# Patient Record
Sex: Male | Born: 1955 | Race: Black or African American | Hispanic: No | Marital: Single | Smoking: Former smoker
Health system: Southern US, Academic
[De-identification: ages and names within clinical notes are randomized; demographics above are authoritative.]

## PROBLEM LIST (undated history)

## (undated) DIAGNOSIS — I10 Essential (primary) hypertension: Secondary | ICD-10-CM

## (undated) DIAGNOSIS — E119 Type 2 diabetes mellitus without complications: Secondary | ICD-10-CM

## (undated) DIAGNOSIS — G459 Transient cerebral ischemic attack, unspecified: Secondary | ICD-10-CM

## (undated) DIAGNOSIS — E7800 Pure hypercholesterolemia, unspecified: Secondary | ICD-10-CM

## (undated) DIAGNOSIS — F419 Anxiety disorder, unspecified: Secondary | ICD-10-CM

## (undated) DIAGNOSIS — J45909 Unspecified asthma, uncomplicated: Secondary | ICD-10-CM

## (undated) DIAGNOSIS — H5440 Blindness, one eye, unspecified eye: Secondary | ICD-10-CM

## (undated) DIAGNOSIS — T300 Burn of unspecified body region, unspecified degree: Secondary | ICD-10-CM

## (undated) DIAGNOSIS — I639 Cerebral infarction, unspecified: Secondary | ICD-10-CM

## (undated) HISTORY — PX: HX FREE SKIN GRAFT: SHX16

## (undated) HISTORY — DX: Transient cerebral ischemic attack, unspecified: G45.9

## (undated) HISTORY — PX: HX TUMOR REMOVAL: SHX12

## (undated) HISTORY — DX: Type 2 diabetes mellitus without complications: E11.9

## (undated) HISTORY — DX: Blindness, one eye, unspecified eye: H54.40

## (undated) HISTORY — DX: Pure hypercholesterolemia, unspecified: E78.00

## (undated) HISTORY — DX: Burn of unspecified body region, unspecified degree: T30.0

## (undated) HISTORY — DX: Unspecified asthma, uncomplicated: J45.909

## (undated) HISTORY — DX: Anxiety disorder, unspecified: F41.9

## (undated) HISTORY — PX: TENDON RELEASE: SHX230

## (undated) HISTORY — DX: Essential (primary) hypertension: I10

## (undated) HISTORY — PX: HX OTHER: 2100001105

## (undated) HISTORY — DX: Cerebral infarction, unspecified (CMS HCC): I63.9

---

## 2006-06-15 ENCOUNTER — Emergency Department (HOSPITAL_COMMUNITY): Payer: Self-pay | Admitting: EXTERNAL

## 2010-06-23 DIAGNOSIS — I639 Cerebral infarction, unspecified: Secondary | ICD-10-CM

## 2010-06-23 HISTORY — DX: Cerebral infarction, unspecified: I63.9

## 2011-08-23 DIAGNOSIS — G8921 Chronic pain due to trauma: Secondary | ICD-10-CM | POA: Insufficient documentation

## 2012-01-12 IMAGING — CT CT CHEST W/ CM
2 of 4 series · 14 of 42 positions shown, 19 images · IV contrast (isovue)
Comparison: none

REASON FOR EXAM: chest pain
COMMENTS:

PROCEDURE:     CT  - CT CHEST WITH CONTRAST  - March 30, 2010  [DATE]
RESULT:     Comparison: None.
TECHNIQUE: Thoracic aorta protocol CT of the chest, after the administration
of 100 mL Isovue 370 intravenous contrast.

[Series 4: aaa · axial · 0.62mm/px · z∈[-265,-16]mm · 11 of 95 slices shown, 16 images (1 of 2)]
[im 6/95  soft-tissue]
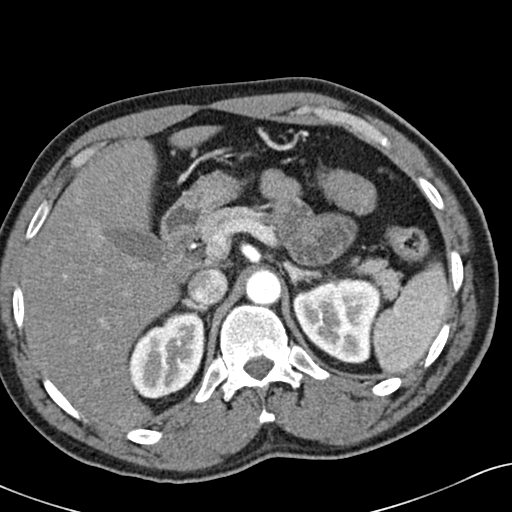
[im 6/95  bone]
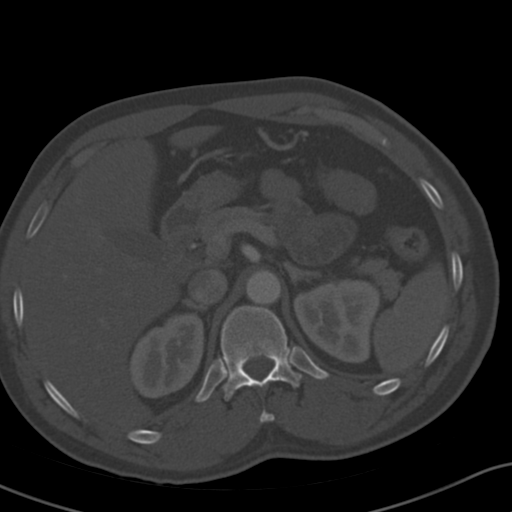
[im 18/95  soft-tissue]
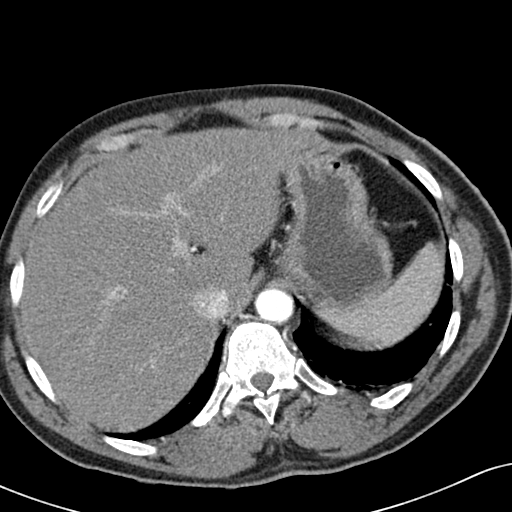
[im 24/95  soft-tissue]
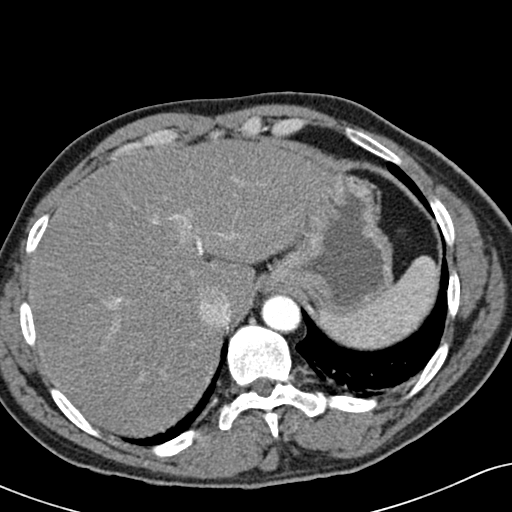
[im 36/95  soft-tissue]
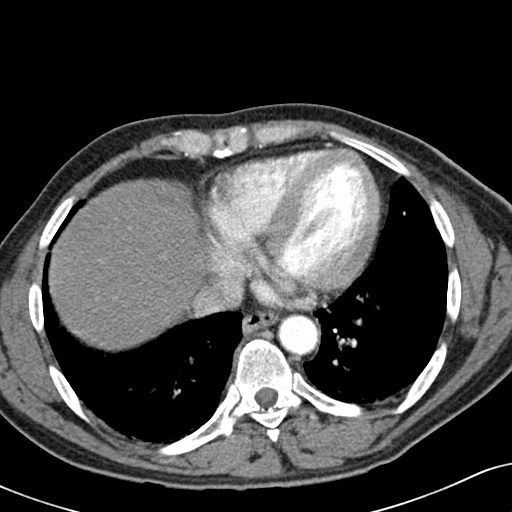
[im 42/95  soft-tissue]
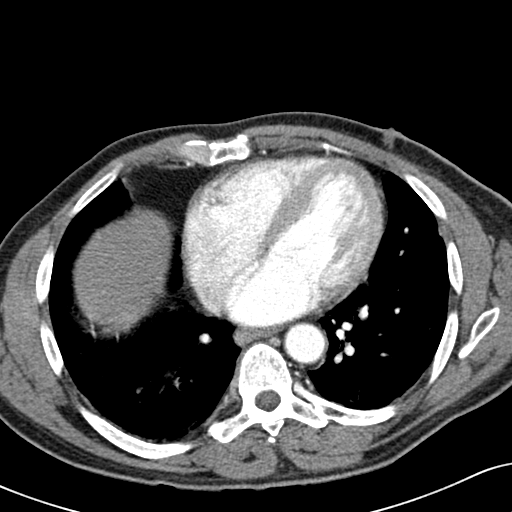
[im 53/95  soft-tissue]
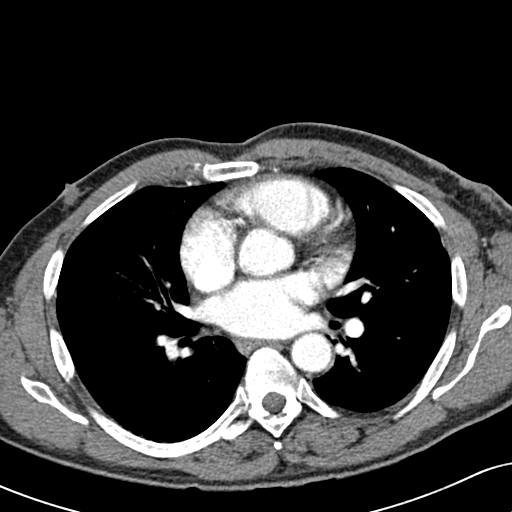
[im 59/95  soft-tissue]
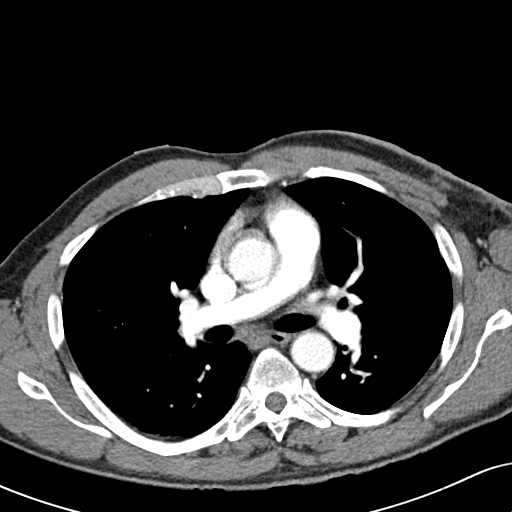
[im 71/95  soft-tissue]
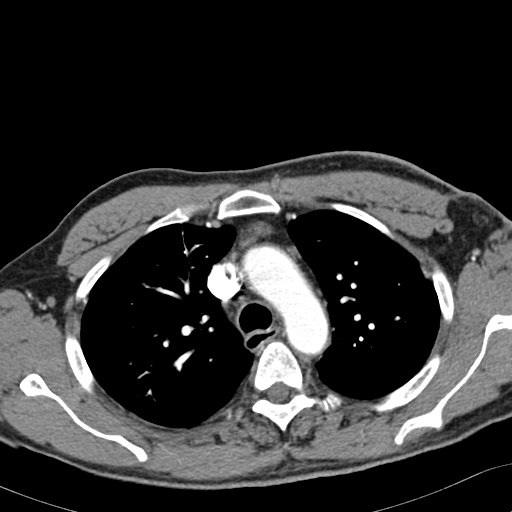
[im 71/95  lung]
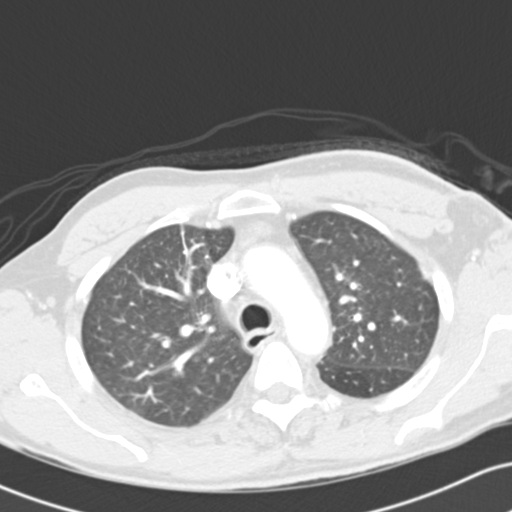
[im 77/95  soft-tissue]
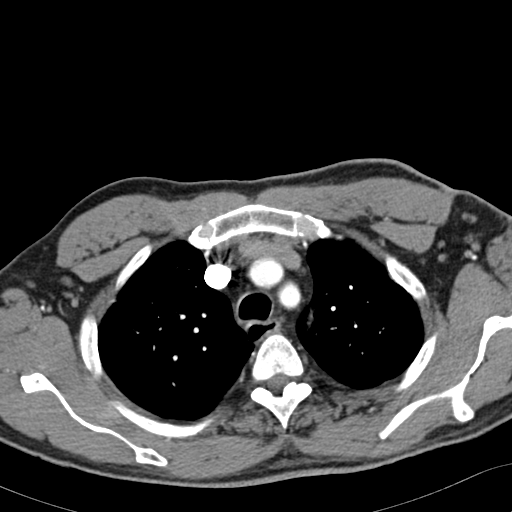
[im 77/95  lung]
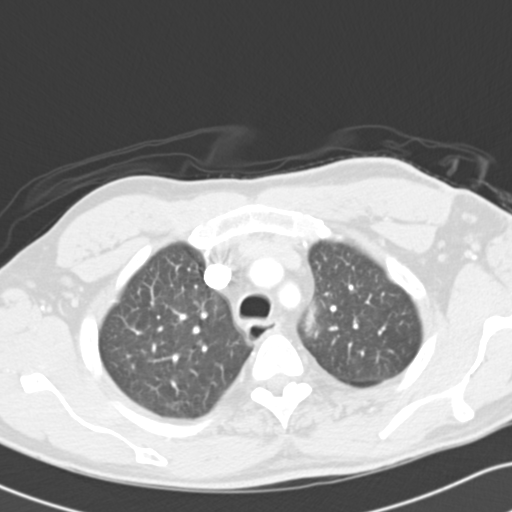
[im 77/95  bone]
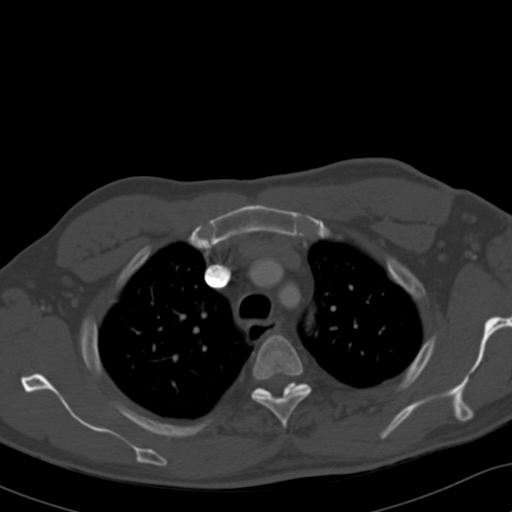
[im 83/95  lung]
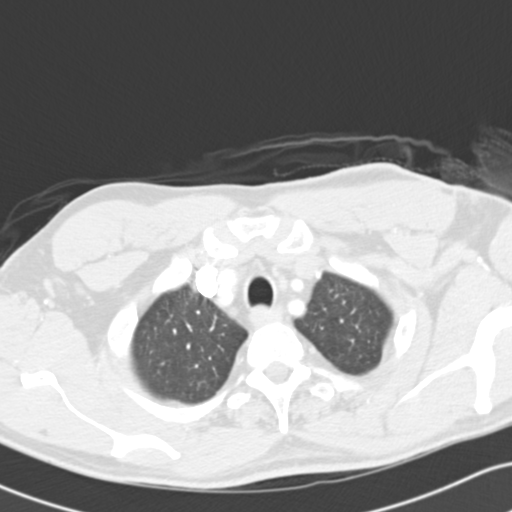
[im 89/95  soft-tissue]
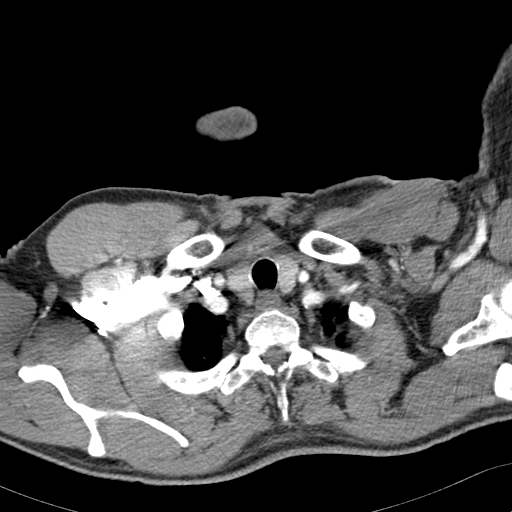
[im 89/95  lung]
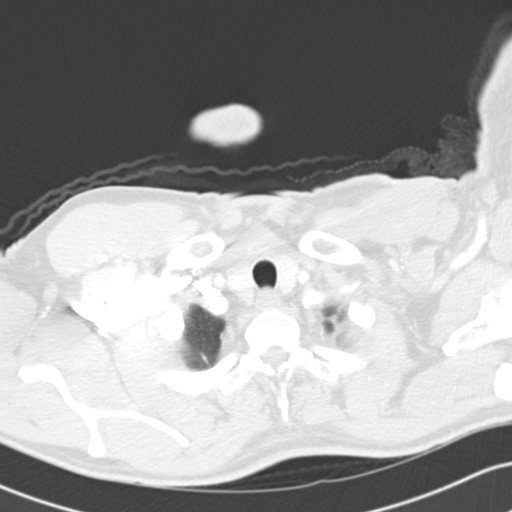

[Series 10: aaa · coronal · 0.62mm/px · 3 of 73 slices shown (2 of 2)]
[im 25/73  soft-tissue]
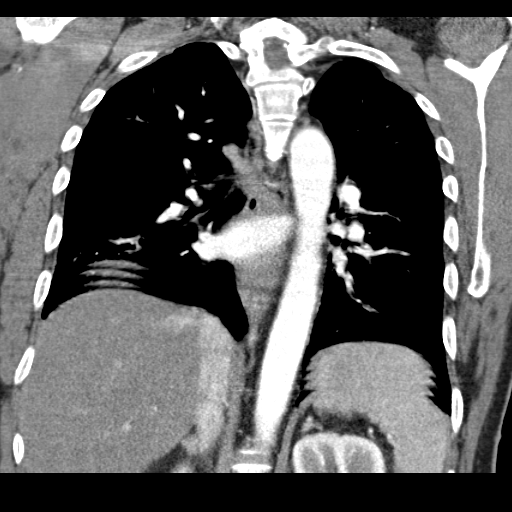
[im 33/73  soft-tissue]
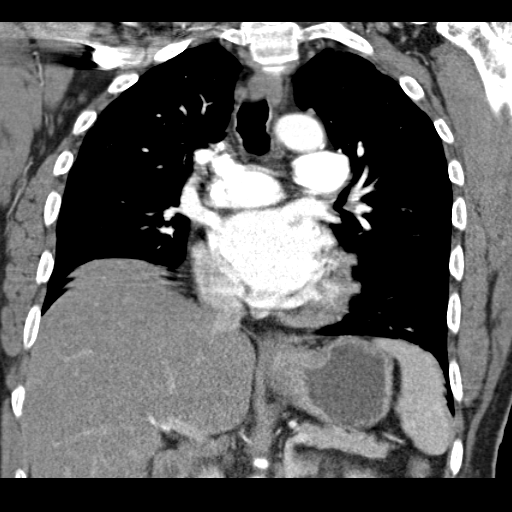
[im 41/73  soft-tissue]
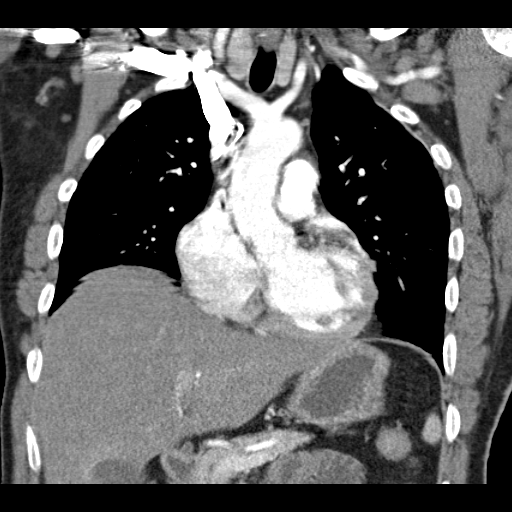

[14 of 42 positions shown; findings below may reference images not displayed]

FINDINGS: Evaluation is limited by respiratory motion. No mediastinal, hilar, or
axillary lymphadenopathy identified. The liver is somewhat low in
attenuation, suggesting hepatic steatosis.

The thoracic aorta is normal in caliber. There is no evidence of aortic
dissection or periaortic hematoma. Evaluation of the aortic root is slightly
limited by cardiac motion and respiratory motion. No central pulmonary
embolus identified.

No focal parenchymal pulmonary opacities identified. Minimal basilar
opacities likely represent atelectasis.

No aggressive lytic or sclerotic osseous lesions identified.
IMPRESSION: No evidence of thoracic aortic dissection, aneurysm, or periaortic hematoma.

## 2012-01-12 IMAGING — CR DG CHEST 1V PORT
1 series · 1 of 1 positions shown · non-contrast
Comparison: none

REASON FOR EXAM: abd pain
COMMENTS:

PROCEDURE:     DXR - DXR PORTABLE CHEST SINGLE VIEW  - March 30, 2010 [DATE]
RESULT:     Comparison: None.

[view not recorded]
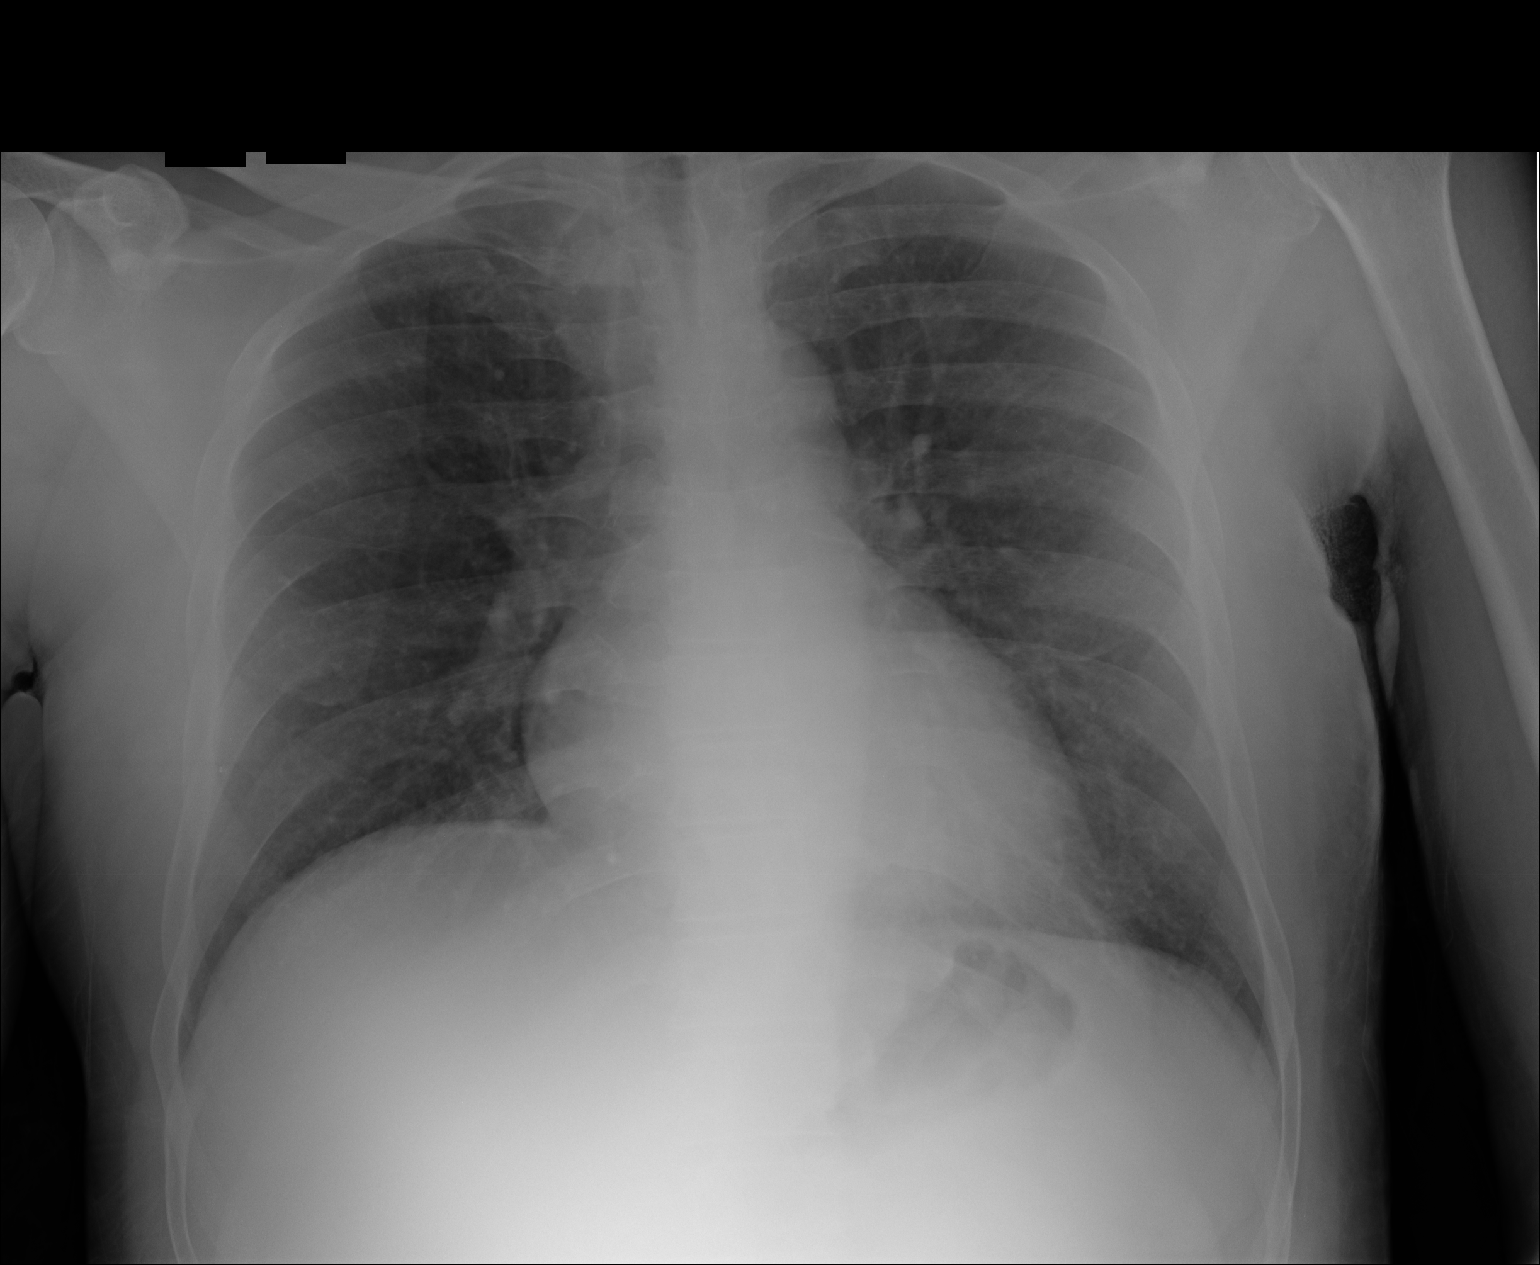

[1 of 1 positions shown; findings below may reference images not displayed]

FINDINGS: Heart size upper limits normal. Nodular density at the left lung apex is
felt to represent the overlying costovertebral junction. The lungs are clear.
IMPRESSION: No acute cardio pulmonary disease.
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
## 2014-08-30 ENCOUNTER — Ambulatory Visit (INDEPENDENT_AMBULATORY_CARE_PROVIDER_SITE_OTHER): Payer: Self-pay

## 2014-08-30 NOTE — Telephone Encounter (Signed)
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 Records are being faxed for review with MRI results. Please review and advise.  Thanks.

## 2014-08-30 NOTE — Telephone Encounter (Signed)
I called and spoke with pt. He said that he is having his medical records released to [place]. As soon as we receive the referral information we will review it and schedule him appropriately. He was seen at [facility] and they said that they can't do the surgery. So he is going to be seen here.  [name] Capri, LPN  09/28/3505, 57:32

## 2014-09-13 ENCOUNTER — Encounter (HOSPITAL_COMMUNITY): Payer: Self-pay
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
## 2014-12-19 ENCOUNTER — Other Ambulatory Visit (INDEPENDENT_AMBULATORY_CARE_PROVIDER_SITE_OTHER): Payer: Self-pay | Admitting: Physician Assistant

## 2014-12-19 ENCOUNTER — Ambulatory Visit (INDEPENDENT_AMBULATORY_CARE_PROVIDER_SITE_OTHER): Payer: MEDICAID | Admitting: Neurological Surgery

## 2014-12-19 ENCOUNTER — Ambulatory Visit (HOSPITAL_COMMUNITY): Payer: Self-pay | Admitting: NEUROSURGERY

## 2014-12-19 ENCOUNTER — Encounter (INDEPENDENT_AMBULATORY_CARE_PROVIDER_SITE_OTHER): Payer: Self-pay | Admitting: Neurological Surgery

## 2014-12-19 VITALS — BP 130/60 | HR 61 | Temp 96.5°F | Resp 16 | Ht 70.0 in | Wt 194.6 lb

## 2014-12-19 DIAGNOSIS — M4806 Spinal stenosis, lumbar region: Secondary | ICD-10-CM

## 2014-12-19 DIAGNOSIS — M546 Pain in thoracic spine: Secondary | ICD-10-CM

## 2014-12-19 DIAGNOSIS — M48061 Spinal stenosis, lumbar region without neurogenic claudication: Secondary | ICD-10-CM

## 2014-12-19 DIAGNOSIS — G96198 Other disorders of meninges, not elsewhere classified: Secondary | ICD-10-CM

## 2014-12-19 DIAGNOSIS — G9619 Other disorders of meninges, not elsewhere classified: Secondary | ICD-10-CM
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
## 2014-12-20 ENCOUNTER — Encounter (INDEPENDENT_AMBULATORY_CARE_PROVIDER_SITE_OTHER): Payer: Self-pay | Admitting: Neurological Surgery

## 2014-12-26 ENCOUNTER — Ambulatory Visit (INDEPENDENT_AMBULATORY_CARE_PROVIDER_SITE_OTHER): Payer: Self-pay | Admitting: Neurological Surgery

## 2014-12-26 NOTE — Telephone Encounter (Signed)
Informed patient MRI lumbar WO scheduled 01-04-15 @ 9:15 am.  Need to arrive at the main entrance of [facility] @ 8:45 am to register.  Patient verbalized understanding.  Faxed and Mailed order.  Sent staff message to front desk to schedule follow up with Dr. Bill Salinas.    [facility] M/CAID YOOJ#[id number] valid 6.29.16 to 8.28.16 7.5tcreamer    [place], MA  12/26/2014, 14:59

## 2015-01-09 ENCOUNTER — Ambulatory Visit (INDEPENDENT_AMBULATORY_CARE_PROVIDER_SITE_OTHER): Payer: Self-pay | Admitting: Neurological Surgery

## 2015-01-09 NOTE — Telephone Encounter (Signed)
-----   Message from Edwyna Shell, [place] sent at 01/09/2015  8:45 AM EDT -----  Regarding: FW: Matthew Sparks-status of MTM form   >> Matthew Sparks 01/09/2015 08:38 AM  Matthew Sparks    Pt calling and states MTM sent paperwork to you to have filled out so he can get transportation tomorrow for his MRI at [facility].   He is needing this filled out ASAP and was calling to check on status of form.  Please call pt back with status.  thanks

## 2015-01-09 NOTE — Telephone Encounter (Signed)
Faxed distance form as requested.   [place], MA  01/09/2015, 16:27

## 2015-01-10 ENCOUNTER — Ambulatory Visit (INDEPENDENT_AMBULATORY_CARE_PROVIDER_SITE_OTHER): Payer: Self-pay | Admitting: Neurological Surgery

## 2015-01-10 NOTE — Telephone Encounter (Signed)
-----   Message from [place], [place] sent at 01/10/2015  1:08 PM EDT -----  >> TINA EDMONDS 01/10/2015 12:49 PM  Dr. Rodman Key, pt's case mgr with [facility] called stating that she just spoke to MTM and they have not received the form they requested.  Cherlyn Cushing wants to know what number you are faxing it to.  Please call her at [phone] ext. [phone]  Previous message regarding form was as follows:    Pt calling and states MTM sent paperwork to you to have filled out so he can get transportation tomorrow for his MRI at [facility].  He is needing this filled out ASAP and was calling to check on status of form. Please call pt back with status. thanks

## 2015-01-10 NOTE — Telephone Encounter (Signed)
I called Matthew Sparks back but had to leave her a message I gave her my direct line to call me back at.  I also left the fax number that I faxed it to yesterday.   [place], MA  01/10/2015, 13:24

## 2015-01-11 ENCOUNTER — Encounter (INDEPENDENT_AMBULATORY_CARE_PROVIDER_SITE_OTHER): Payer: Self-pay | Admitting: Neurological Surgery

## 2015-01-23 ENCOUNTER — Ambulatory Visit (INDEPENDENT_AMBULATORY_CARE_PROVIDER_SITE_OTHER): Payer: Self-pay | Admitting: Neurological Surgery

## 2015-01-23 NOTE — Telephone Encounter (Signed)
I called patient to let him know that I received the paper just waiting for Heather to sign for me.   Patient verbally agreed to this.   [place], MA  01/23/2015, 14:33

## 2015-01-23 NOTE — Telephone Encounter (Signed)
-----   Message from [place], [place] sent at 01/23/2015  1:10 PM EDT -----  Regarding: FW: Matthew Sparks  >> JULIE COLLINS 01/23/2015 12:59 PM  Matthew Sparks pt - Pt is calling about a distance verification form for his upcoming 02/13/2015 appt. Pt would like to know if we got this form. Thanks.

## 2015-01-25 ENCOUNTER — Ambulatory Visit (INDEPENDENT_AMBULATORY_CARE_PROVIDER_SITE_OTHER): Payer: Self-pay | Admitting: Neurological Surgery

## 2015-01-25 NOTE — Telephone Encounter (Signed)
Faxed mileage form over as requested, had to wait for Heather to sign it.   [place], MA  01/25/2015, 16:09

## 2015-01-31 ENCOUNTER — Ambulatory Visit (INDEPENDENT_AMBULATORY_CARE_PROVIDER_SITE_OTHER): Payer: Self-pay | Admitting: Neurological Surgery

## 2015-01-31 NOTE — Telephone Encounter (Signed)
I called patient back to let him know that I will redo the order and fax it back to them.   Patient was very confused on why they denied him, he then went on to tell me the previous   Doctor that he was seeing close to him was out of network for him so then he was referred to   Dr. Bill Salinas who is currently in network for him. I told him that I was going to use that for the   Reasoning for his form, patient verbally agreed. He also stated that he should have the right to   Be able to stay with Dr. Bill Salinas, he does not want to leave her care he likes her and she went through   A lot of trouble for him to help him out he stated. I faxed over the corrected form as requested.   [place], MA  01/31/2015, 13:29
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
## 2015-02-01 ENCOUNTER — Ambulatory Visit (INDEPENDENT_AMBULATORY_CARE_PROVIDER_SITE_OTHER): Payer: Self-pay | Admitting: Neurological Surgery

## 2015-02-01 NOTE — Telephone Encounter (Signed)
I called patient and Matthew Sparks from [facility] and explained that once I heard back from the PA or doctor I would call them back.  The patient was persistent about still seeing Dr.[name] for the surgery (if he was surgery) and could make it to the surgery day.  I explained thinking long term he would still need to follow up for post operative visits in our clinic.  After explaining this to him he was ok with being referred to [facility] to see a neurosurgeon.  I sent a message to PA to see if we could refer him.    Lowella Dell Filius, MA  02/01/2015, 13:24

## 2015-02-02 ENCOUNTER — Ambulatory Visit (INDEPENDENT_AMBULATORY_CARE_PROVIDER_SITE_OTHER): Payer: Self-pay | Admitting: Neurological Surgery

## 2015-02-02 NOTE — Telephone Encounter (Signed)
I called patient back he states that he is going to try to be seen up at [facility] since the address is the same for the hospital and Dr. Bill Salinas.   Then the patient is going to try to have surgery up at [facility] as well. Patient requested his apt be with Dr. Bill Salinas but up at [facility].   I sent a message to the call center to change it for me and for them to call the patient and let him know.   [place], MA  02/02/2015, 08:06

## 2015-02-02 NOTE — Telephone Encounter (Signed)
-----   Message from [place] sent at 02/02/2015  7:54 AM EDT -----  Regarding: Matthew Sparks  >> AGNES COROB 02/02/2015 07:54 AM  Matthew Sparks pt- pt has me confused.  Pt canc his appt at [facility] and reschedule it to [place].  Pt states that "they won't pay him to come to appt  @ [place]".  Pt was also talking about surg but i cannot tell if he has been scheduled for surg.  Can reach pt at [phone].  Pt asked this message to be sent i'm really not sure what he wants to speak with you about.  Thank you

## 2015-02-05 ENCOUNTER — Ambulatory Visit (INDEPENDENT_AMBULATORY_CARE_PROVIDER_SITE_OTHER): Payer: Self-pay | Admitting: Neurological Surgery

## 2015-02-05 NOTE — Telephone Encounter (Signed)
I called patient and left message stating that I would keep his apt with Dr. Bill Salinas   On 02-13-15 for 10 am. I told him if he could not make this apt we would need to   Push it out until September or October. Sent message to front desk to have patient   Put back onto the schedule.   [place], MA  02/05/2015, 11:04

## 2015-02-06 DIAGNOSIS — I6522 Occlusion and stenosis of left carotid artery: Secondary | ICD-10-CM

## 2015-02-08 ENCOUNTER — Encounter (INDEPENDENT_AMBULATORY_CARE_PROVIDER_SITE_OTHER): Payer: MEDICAID | Admitting: Neurological Surgery

## 2015-02-13 ENCOUNTER — Ambulatory Visit (INDEPENDENT_AMBULATORY_CARE_PROVIDER_SITE_OTHER): Payer: MEDICAID | Admitting: Neurological Surgery

## 2015-02-13 ENCOUNTER — Encounter (INDEPENDENT_AMBULATORY_CARE_PROVIDER_SITE_OTHER): Payer: MEDICAID | Admitting: Neurological Surgery

## 2015-02-13 ENCOUNTER — Encounter (INDEPENDENT_AMBULATORY_CARE_PROVIDER_SITE_OTHER): Payer: Self-pay | Admitting: Neurological Surgery

## 2015-02-13 VITALS — BP 120/76 | HR 69 | Temp 96.4°F | Resp 16 | Ht 70.0 in | Wt 188.4 lb

## 2015-02-13 DIAGNOSIS — G930 Cerebral cysts: Principal | ICD-10-CM

## 2015-02-13 DIAGNOSIS — T300 Burn of unspecified body region, unspecified degree: Secondary | ICD-10-CM
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
## 2015-02-14 ENCOUNTER — Ambulatory Visit (INDEPENDENT_AMBULATORY_CARE_PROVIDER_SITE_OTHER): Payer: Self-pay | Admitting: Neurological Surgery
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
## 2015-02-14 NOTE — Telephone Encounter (Signed)
Faxed referral to Tammy at the [facility].  [place], MA  02/14/2015, 15:25

## 2015-02-15 ENCOUNTER — Telehealth (INDEPENDENT_AMBULATORY_CARE_PROVIDER_SITE_OTHER): Payer: Self-pay | Admitting: Neurological Surgery
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
## 2015-02-15 NOTE — Telephone Encounter (Signed)
I called and left message for patient to let him know that I spoke to Gene at Dr. Roxana Hires office and they are going to   Try to get him in for the next available which may be around October. She stated that she was going to mail an apt letter out for   Him, She received all of our information and he will not need to be considered a new patient due to him being seen there back in  2015. I left there office number for patient incase he needed to contact them.   [place], MA  02/15/2015, 14:40

## 2015-02-19 ENCOUNTER — Ambulatory Visit (INDEPENDENT_AMBULATORY_CARE_PROVIDER_SITE_OTHER): Payer: Self-pay | Admitting: Neurological Surgery

## 2015-02-19 NOTE — Telephone Encounter (Signed)
Informed patient CT myelogram thoracic scheduled 03-09-15 @ 10:00 am.  Need to arrive at the main entrance of [facility] @ 8:30 am to register.  Patient verbalized understanding.  Faxed and Mailed order.  No follow up needed patient will be seeing Dr. Bill Salinas on 03-20-15 already.     Hampton Sparks M/CAID auth#[id number]. This request is only valid from 02/14/2015 to 04/15/2015. [email or web address]    Matthew Beach, MA  02/19/2015, 15:30

## 2015-02-22 ENCOUNTER — Encounter (INDEPENDENT_AMBULATORY_CARE_PROVIDER_SITE_OTHER): Payer: MEDICAID | Admitting: Neurological Surgery

## 2015-03-08 ENCOUNTER — Ambulatory Visit (INDEPENDENT_AMBULATORY_CARE_PROVIDER_SITE_OTHER): Payer: Self-pay | Admitting: Neurological Surgery
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
## 2015-03-08 NOTE — Telephone Encounter (Signed)
-----   Message from Mayme Genta sent at 03/08/2015  8:14 AM EDT -----  Regarding: sedney  >> DONNA MILLER 03/08/2015 08:14 AM  [name] - pt is scheduled for CT Myelogram tomorrow and said he forgot and took Tramadol yesterday.  Does he need to be rescheduled? Please call.    Thanks  Butch Penny

## 2015-03-08 NOTE — Telephone Encounter (Signed)
I called and spoke to out patient surgery and they stated that as long as if was taken before his scheduled myelogram he will   Be fine, but if it was after 10 am then he would need to reschedule it. I called patient and he stated that he did take it earlier than   10 am he took it as soon as he got up not thinking. I told him that was fine and to make sure that he does not take anymore.   He verbally agreed to this.   [place], MA  03/08/2015, 08:24

## 2015-03-14 ENCOUNTER — Ambulatory Visit (INDEPENDENT_AMBULATORY_CARE_PROVIDER_SITE_OTHER): Payer: Self-pay | Admitting: Neurological Surgery

## 2015-03-14 NOTE — Telephone Encounter (Signed)
I called patient to let him know that I would need to move his follow up with Dr. Bill Salinas out since he moved his myelogram apt.   Paitent verbally agreed to this and I told him that it would be on 04-17-15 he was ok with this once he realized that she would only be here  Two days in October. He wanted me to mail him out a time and date and I did as requested.   [place], MA  03/14/2015, 10:32

## 2015-03-20 ENCOUNTER — Encounter (INDEPENDENT_AMBULATORY_CARE_PROVIDER_SITE_OTHER): Payer: MEDICAID | Admitting: Neurological Surgery

## 2015-04-17 ENCOUNTER — Encounter (INDEPENDENT_AMBULATORY_CARE_PROVIDER_SITE_OTHER): Payer: Self-pay | Admitting: Neurological Surgery

## 2015-04-17 ENCOUNTER — Ambulatory Visit (INDEPENDENT_AMBULATORY_CARE_PROVIDER_SITE_OTHER): Payer: MEDICAID | Admitting: Neurological Surgery

## 2015-04-17 VITALS — BP 116/60 | HR 72 | Temp 96.2°F | Resp 16 | Ht 70.0 in | Wt 180.0 lb

## 2015-04-17 DIAGNOSIS — M545 Low back pain, unspecified: Secondary | ICD-10-CM

## 2015-04-17 DIAGNOSIS — M546 Pain in thoracic spine: Secondary | ICD-10-CM
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
## 2015-04-17 NOTE — Progress Notes (Signed)
Staff note:  I personally saw and evaluated the patient. See mid-level's note for additional details. My findings/participation are:  59 yo male here in followup of myelogram.  Myelogram shows no filling defect or slow filling to suggest arachnoid cyst.  I don't definitively see anything to operate on at this point, especially given his symptoms which are vague and not suggestive of thoracic myelopathy.  However, he continues to have problems with gait so we will try to get his records from [facility] and I will show his pictures around.  I will call him with further information given his long distance away.    Leeroy Cha, MD

## 2015-04-18 ENCOUNTER — Ambulatory Visit (INDEPENDENT_AMBULATORY_CARE_PROVIDER_SITE_OTHER): Payer: Self-pay | Admitting: Neurological Surgery

## 2015-04-18 NOTE — Telephone Encounter (Addendum)
I called and left a message advising patient that he will need to contact Medical records to check the status of the form. If he calls back please advise him of this.  [name] Capri, LPN  97/28/2060, 15:61      ----- Message from Mayme Genta sent at 04/18/2015  7:58 AM EDT -----  Regarding: sedney  >> DONNA MILLER 04/18/2015 07:58 AM  sedney - pt states that MTM is sending a form for you to sign and he would like a call once it is filled out.       Thanks,  Butch Penny

## 2015-05-02 ENCOUNTER — Telehealth (INDEPENDENT_AMBULATORY_CARE_PROVIDER_SITE_OTHER): Payer: Self-pay | Admitting: Neurological Surgery

## 2015-05-02 NOTE — Telephone Encounter (Signed)
I called and left message for patient letting him know that we cannot give him anything stating that and that   Was per Dr. Bill Salinas. I left my direct line for him to call back with any questions.   [place], MA  05/02/2015, 12:01
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
## 2015-05-22 ENCOUNTER — Encounter (INDEPENDENT_AMBULATORY_CARE_PROVIDER_SITE_OTHER): Payer: Self-pay | Admitting: Neurological Surgery

## 2015-05-22 ENCOUNTER — Ambulatory Visit (INDEPENDENT_AMBULATORY_CARE_PROVIDER_SITE_OTHER): Payer: MEDICAID | Admitting: Neurological Surgery

## 2015-05-22 VITALS — BP 124/66 | HR 62 | Temp 96.8°F | Resp 16 | Ht 70.0 in | Wt 183.2 lb

## 2015-05-22 DIAGNOSIS — R202 Paresthesia of skin: Secondary | ICD-10-CM

## 2015-05-22 DIAGNOSIS — M545 Low back pain, unspecified: Secondary | ICD-10-CM

## 2015-05-22 DIAGNOSIS — M546 Pain in thoracic spine: Secondary | ICD-10-CM
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
## 2015-08-28 DIAGNOSIS — I25119 Atherosclerotic heart disease of native coronary artery with unspecified angina pectoris: Secondary | ICD-10-CM

## 2015-08-28 DIAGNOSIS — I6522 Occlusion and stenosis of left carotid artery: Secondary | ICD-10-CM

## 2015-10-05 DIAGNOSIS — I6522 Occlusion and stenosis of left carotid artery: Secondary | ICD-10-CM

## 2015-10-16 ENCOUNTER — Ambulatory Visit (INDEPENDENT_AMBULATORY_CARE_PROVIDER_SITE_OTHER): Payer: Self-pay | Admitting: Neurological Surgery
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
## 2015-10-16 NOTE — Telephone Encounter (Signed)
Called patient back and per Dr.[name] via Fara Olden patient will need update imaging.  Patient prefers to have the tests here at [facility] and for [place] to schedule.  Scheduled him to come in to see midlevel on 11-01-15 @ 9:30a.m.  Mailed out letter.  Patient is able to have MRI done.    [place], MA  10/16/2015, 08:13

## 2015-11-01 ENCOUNTER — Ambulatory Visit (INDEPENDENT_AMBULATORY_CARE_PROVIDER_SITE_OTHER): Payer: MEDICAID | Admitting: Physician Assistant

## 2015-11-01 ENCOUNTER — Encounter (INDEPENDENT_AMBULATORY_CARE_PROVIDER_SITE_OTHER): Payer: Self-pay | Admitting: Physician Assistant

## 2015-11-01 VITALS — BP 152/78 | HR 74 | Temp 96.5°F | Resp 16 | Ht 70.0 in | Wt 190.2 lb

## 2015-11-01 DIAGNOSIS — R251 Tremor, unspecified: Secondary | ICD-10-CM

## 2015-11-01 DIAGNOSIS — M79605 Pain in left leg: Secondary | ICD-10-CM

## 2015-11-01 DIAGNOSIS — M549 Dorsalgia, unspecified: Secondary | ICD-10-CM

## 2015-11-01 DIAGNOSIS — R531 Weakness: Secondary | ICD-10-CM

## 2015-11-01 DIAGNOSIS — M79604 Pain in right leg: Secondary | ICD-10-CM

## 2015-11-01 DIAGNOSIS — G959 Disease of spinal cord, unspecified: Secondary | ICD-10-CM
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
## 2015-11-02 ENCOUNTER — Ambulatory Visit (INDEPENDENT_AMBULATORY_CARE_PROVIDER_SITE_OTHER): Payer: Self-pay | Admitting: Neurological Surgery

## 2015-11-02 NOTE — Telephone Encounter (Signed)
   He was supposed to call back with info on whether or not he had a carotid stent placed in his neck and based off that I was going to order a MRI Brain vs CT brain.   Mentioned nothing about his court case or anything. I am just ordering testing.  [facility] does not make any statements in general regarding court claims.     I called patient back and let him know that was not mentioned at the visit yesterday and told him that I could fax up the office note up there and he was ok with that. I asked about the possible stent and he was not sure about it yet he will call us back once it determined. Office note faxed up to PCP.   [place], MA  11/02/2015, 13:54

## 2015-11-02 NOTE — Telephone Encounter (Signed)
Regarding: Matthew Sparks  ----- Message from Harrietta Guardian sent at 11/02/2015 12:33 PM EDT -----  Matthew Sparks    Pt is calling and asking for someone to call him about his appt yesterday.   pls call him back.  Thanks

## 2015-11-02 NOTE — Telephone Encounter (Signed)
Patient called and wanted to know if we could mail his letter to him that Nira Conn was going to type up for him for his court date on 11-07-15. I told him that we could fax or mail it to them if he wanted but we could not email it. He agreed for me to fax it to his PCP with a note stating that he would pick it up and I did confirm his PCP's office as well. I sent Nira Conn a message to see what exactly I needed to fax.   [place], MA  11/02/2015, 12:53

## 2015-11-12 ENCOUNTER — Ambulatory Visit (INDEPENDENT_AMBULATORY_CARE_PROVIDER_SITE_OTHER): Payer: Self-pay | Admitting: Neurological Surgery

## 2015-11-12 ENCOUNTER — Other Ambulatory Visit (INDEPENDENT_AMBULATORY_CARE_PROVIDER_SITE_OTHER): Payer: Self-pay | Admitting: Physician Assistant

## 2015-11-12 DIAGNOSIS — R531 Weakness: Secondary | ICD-10-CM

## 2015-11-12 DIAGNOSIS — G959 Disease of spinal cord, unspecified: Secondary | ICD-10-CM

## 2015-11-12 DIAGNOSIS — Z8673 Personal history of transient ischemic attack (TIA), and cerebral infarction without residual deficits: Secondary | ICD-10-CM

## 2015-11-12 DIAGNOSIS — R251 Tremor, unspecified: Secondary | ICD-10-CM

## 2015-11-12 NOTE — Progress Notes (Signed)
Patient called back stating he did not have carotid artery stent placed. Wants to proceed with MRI Brain w/ and w/o gad.           Catha Gosselin, [name]  11/12/2015, 11:07

## 2015-11-12 NOTE — Telephone Encounter (Signed)
Patient called in to let us know that he does not have a carotid stent placed in his neck and that he would like to proceed with his MRI @ [facility]. Will send message to Nira Conn to let her know.  Damian Leavell, MA  11/12/2015, 10:27

## 2015-11-13 ENCOUNTER — Ambulatory Visit (INDEPENDENT_AMBULATORY_CARE_PROVIDER_SITE_OTHER): Payer: Self-pay | Admitting: Physician Assistant

## 2015-11-13 NOTE — Telephone Encounter (Signed)
Myelograms scheduled.  [place].  Need to review all the instructions with him as well.  [place], MA  11/13/2015, 09:50

## 2015-11-14 ENCOUNTER — Ambulatory Visit (INDEPENDENT_AMBULATORY_CARE_PROVIDER_SITE_OTHER): Payer: Self-pay | Admitting: Physician Assistant

## 2015-11-14 NOTE — Telephone Encounter (Signed)
CT Myelogram C-Spine, T-Spine, L-Spine 11-26-15 @ 10:00a.m arrive @ 8:00a.m.  Explained all instructions in very detailed manner along with where to go, when to arrive, [facility] medication to stop and when to stop, and NPO instructions. Also mailed order and instructions to patient.  Confirmed all with patient's significant other, Mariann Laster as he handed her the phone.  She wrote everything down.  Waiting on MRI before scheduling follow up.  Lowella Dell [place], MA  11/14/2015, 09:21      Patient called back and he has another appointment that day.  He will call and reschedule.       CT Myelogram [place] M/CAID auth#[id number]. This request is only valid from 11/06/2015 to 01/05/2016. 5.18TCREAMER  CT Myelogram Thoracic-[facility] M/CAID auth# [id number]. This request is only valid from 11/06/2015 to 01/05/2016. 5.18TCREAMER  CT Myelogram Lumbar-[facility] M/CAID auth#[id number]. This request is only valid from 11/06/2015 to 01/05/2016. 5.18TCREAMER  Fluoro Myelogram-[facility] M/CAID no atuh req 5.18TCREAMER

## 2015-11-27 ENCOUNTER — Ambulatory Visit (INDEPENDENT_AMBULATORY_CARE_PROVIDER_SITE_OTHER): Payer: Self-pay | Admitting: Neurological Surgery

## 2015-11-27 NOTE — Telephone Encounter (Signed)
MRI Brain W/WO 12-11-15 12:15p.m arrive @ 11:15a.m for labs.  Confirmed with patient. Reminded patient that we still have not been able to reach his PCP not has his PCP contacted [place] back.  i gave him our fax number to have them send a medical record request.  He will wait till he has this MRI done.   [place], MA  11/27/2015, 09:26    [facility] M/CAIDauth#[id number]. This request is only valid from 11/13/2015 to 01/12/2016. 5.30/tcreamer

## 2015-11-30 ENCOUNTER — Ambulatory Visit (INDEPENDENT_AMBULATORY_CARE_PROVIDER_SITE_OTHER): Payer: Self-pay | Admitting: Neurological Surgery

## 2015-11-30 NOTE — Telephone Encounter (Signed)
Regarding: [name] pt   ----- Message from Ronita Hipps sent at 11/30/2015 11:14 AM EDT -----  Bill Salinas pt     Pt states [place] Kynard wanted  a fax number to his doctors office Dr.Muchy this is the fax [phone] He would also like for Stephane to give him a call .    Thanks

## 2015-11-30 NOTE — Telephone Encounter (Signed)
Faxed office notes to "Dr.Muchy patient's PCP" from 11-01-15 and 05-21-16 and Myelogram reports at the number [phone].  Confirmation received.  He is aware anymore than the last two office notes they are to send a request to [place] records.  [place], MA  11/30/2015, 11:30

## 2015-12-21 ENCOUNTER — Ambulatory Visit (INDEPENDENT_AMBULATORY_CARE_PROVIDER_SITE_OTHER): Payer: Self-pay | Admitting: Neurological Surgery
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
## 2015-12-21 NOTE — Telephone Encounter (Signed)
Regarding: Matthew Sparks pt   ----- Message from Ronita Hipps sent at 12/21/2015 11:53 AM EDT -----  Matthew Sparks pt     Pt states his allergy in the system is wrong please call patient to discuss.    Thanks

## 2015-12-26 ENCOUNTER — Ambulatory Visit (INDEPENDENT_AMBULATORY_CARE_PROVIDER_SITE_OTHER): Payer: Self-pay | Admitting: Neurological Surgery

## 2015-12-26 NOTE — Telephone Encounter (Signed)
-----   Message from [name] sent at 12/26/2015 10:05 AM EDT -----  Dr. Bill Salinas  Patient called asking if you received the records he sent to you from [facility] about his stroke.  Please call him at [phone]

## 2015-12-26 NOTE — Telephone Encounter (Signed)
Explained to patient have not received those records.  Gave our fax number.  [place], MA  12/26/2015, 10:16

## 2015-12-31 ENCOUNTER — Ambulatory Visit (INDEPENDENT_AMBULATORY_CARE_PROVIDER_SITE_OTHER): Payer: Self-pay | Admitting: Neurological Surgery

## 2015-12-31 NOTE — Telephone Encounter (Signed)
Returned patient's call.  Explained to patient in message that we did receive his stroke information.  [place], MA  12/31/2015, 12:47

## 2015-12-31 NOTE — Telephone Encounter (Signed)
-----   Message from [facility] sent at 12/31/2015 11:51 AM EDT -----  Matthew Sparks    Patient is calling to speak with Colletta Maryland to see if the records had been received yet. Please call him to advise, thank you.     ----- Message from [name] sent at 12/26/2015 10:05 AM EDT -----  Dr. [name]  Patient called asking if you received the records he sent to you from [facility] about his stroke.  Please call him at [phone]

## 2016-01-03 ENCOUNTER — Ambulatory Visit (INDEPENDENT_AMBULATORY_CARE_PROVIDER_SITE_OTHER): Payer: Self-pay | Admitting: Neurological Surgery

## 2016-01-03 NOTE — Telephone Encounter (Signed)
-----   Message from [place] sent at 01/03/2016  8:34 AM EDT -----  Pt called in again today to check on the status of this.     ----- Message from Bethann Berkshire sent at 12/31/2015 11:51 AM EDT -----  Bill Salinas    Patient is calling to speak with Colletta Maryland to see if the records had been received yet. Please call him to advise, thank you.     ----- Message from [name] sent at 12/26/2015 10:05 AM EDT -----  Dr. Bill Salinas  Patient called asking if you received the records he sent to you from [facility] about his stroke.  Please call him at [phone]

## 2016-01-03 NOTE — Telephone Encounter (Signed)
Left message again and explained that we did receive his records.  Second message left.  [place], MA  01/03/2016, 10:08

## 2016-01-21 ENCOUNTER — Ambulatory Visit (INDEPENDENT_AMBULATORY_CARE_PROVIDER_SITE_OTHER): Payer: Self-pay | Admitting: Neurological Surgery

## 2016-01-21 NOTE — Telephone Encounter (Signed)
-----   Message from [name] sent at 01/21/2016  1:41 PM EDT -----  Dr. Bill Salinas pt  Patient calling because his apt with Dr. Bill Salinas was scheduled in [place] he wants to see her in [place]. Please call him to advise when she will be at that location.  Thank you.

## 2016-01-21 NOTE — Telephone Encounter (Signed)
Called patient back and he decided he will go to [place].  He also wanted to verify we received his information and to reiterate that he was not allergic to anything.  Lowella Dell [place], MA  01/21/2016, 14:24

## 2016-02-07 ENCOUNTER — Ambulatory Visit: Payer: MEDICAID | Attending: Neurological Surgery | Admitting: Neurological Surgery

## 2016-02-07 ENCOUNTER — Encounter (INDEPENDENT_AMBULATORY_CARE_PROVIDER_SITE_OTHER): Payer: Self-pay | Admitting: Neurological Surgery

## 2016-02-07 VITALS — BP 142/80 | HR 68 | Temp 95.8°F | Ht 70.0 in | Wt 193.8 lb

## 2016-02-07 DIAGNOSIS — Z8673 Personal history of transient ischemic attack (TIA), and cerebral infarction without residual deficits: Secondary | ICD-10-CM | POA: Insufficient documentation

## 2016-02-07 DIAGNOSIS — M79605 Pain in left leg: Secondary | ICD-10-CM | POA: Insufficient documentation

## 2016-02-07 DIAGNOSIS — M549 Dorsalgia, unspecified: Secondary | ICD-10-CM

## 2016-02-07 DIAGNOSIS — G930 Cerebral cysts: Principal | ICD-10-CM | POA: Insufficient documentation

## 2016-02-07 DIAGNOSIS — Z9889 Other specified postprocedural states: Secondary | ICD-10-CM | POA: Insufficient documentation

## 2016-02-07 DIAGNOSIS — M79604 Pain in right leg: Secondary | ICD-10-CM | POA: Insufficient documentation

## 2016-02-07 DIAGNOSIS — M545 Low back pain: Secondary | ICD-10-CM | POA: Insufficient documentation

## 2016-02-07 DIAGNOSIS — E119 Type 2 diabetes mellitus without complications: Secondary | ICD-10-CM | POA: Insufficient documentation

## 2016-02-07 DIAGNOSIS — M5414 Radiculopathy, thoracic region: Secondary | ICD-10-CM | POA: Insufficient documentation

## 2016-02-07 DIAGNOSIS — Z7902 Long term (current) use of antithrombotics/antiplatelets: Secondary | ICD-10-CM | POA: Insufficient documentation

## 2016-02-07 DIAGNOSIS — D3612 Benign neoplasm of peripheral nerves and autonomic nervous system, upper limb, including shoulder: Secondary | ICD-10-CM | POA: Insufficient documentation

## 2016-02-07 LAB — POC BLOOD GLUCOSE (RESULTS): GLUCOSE, POC: 128 mg/dL — ABNORMAL HIGH (ref 70–105)
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
## 2016-02-28 ENCOUNTER — Ambulatory Visit (INDEPENDENT_AMBULATORY_CARE_PROVIDER_SITE_OTHER): Payer: Self-pay | Admitting: Neurological Surgery

## 2016-02-28 NOTE — Telephone Encounter (Addendum)
Regarding: [name] Pt - referral letter Request  ----- Message from Carie Caddy sent at 02/27/2016 12:47 PM EDT -----  MSG sent to Mgnt office per [facility] office.    PT said dr was referring him to a NS dr in [facility].  Prog notes state : Will fu with multi disciplinary team in [place] for possible traction.        ----- Message from Carie Caddy sent at 02/27/2016 12:17 PM EDT -----  Bill Salinas Pt    Pt States that Dr Bill Salinas told him he needs referred to a neuro surgeon in [place].  Pt states he needs referral letter to give to Dr Ernst Spell and Insurance that he is being referred to a Chief of Staff in [place].      PT also has an appt scheduled on  9/19 - wants to know since he is being referred to a neuro surgeon, should he keep this appt.    Please call pt [phone]    -----------------------------------------------------------------------  I called and spoke with patient. I advised that Dr. Bill Salinas sent her letter to his PCP recommended that he may benefit from coordination of care with the surgeon in [facility], but I advised that his PCP will need to be the one to place the referral. I advised that he can contact medical records to get any records sent. He verbalized understanding.  [name] Capri, LPN  [id number], [id number]

## 2016-03-11 ENCOUNTER — Encounter (INDEPENDENT_AMBULATORY_CARE_PROVIDER_SITE_OTHER): Payer: MEDICAID | Admitting: Neurological Surgery

## 2016-03-20 ENCOUNTER — Encounter (INDEPENDENT_AMBULATORY_CARE_PROVIDER_SITE_OTHER): Payer: MEDICAID | Admitting: Neurological Surgery

## 2016-03-25 DIAGNOSIS — I6522 Occlusion and stenosis of left carotid artery: Secondary | ICD-10-CM

## 2016-04-22 DIAGNOSIS — L905 Scar conditions and fibrosis of skin: Secondary | ICD-10-CM | POA: Insufficient documentation

## 2016-06-22 ENCOUNTER — Other Ambulatory Visit: Payer: Self-pay

## 2017-01-06 DIAGNOSIS — F418 Other specified anxiety disorders: Secondary | ICD-10-CM | POA: Insufficient documentation

## 2017-01-06 DIAGNOSIS — I639 Cerebral infarction, unspecified: Secondary | ICD-10-CM | POA: Insufficient documentation

## 2017-01-06 DIAGNOSIS — G629 Polyneuropathy, unspecified: Secondary | ICD-10-CM | POA: Insufficient documentation

## 2017-03-31 DIAGNOSIS — I6522 Occlusion and stenosis of left carotid artery: Secondary | ICD-10-CM

## 2017-08-20 ENCOUNTER — Ambulatory Visit (INDEPENDENT_AMBULATORY_CARE_PROVIDER_SITE_OTHER): Payer: Self-pay | Admitting: Neurological Surgery

## 2017-08-20 NOTE — Telephone Encounter (Addendum)
Regarding: [name]   ----- Message from Sarina Ser sent at 08/18/2017 12:25 PM EST -----  Lovie Macadamia states that she received a call from [facility] stating that they have a single case agreement through [facility] for the pt. She is unsure if we received any WC info on the pt, and states that she was not given much info, but is assuming that the pt needs to get an appt. Pt last saw Dr Bill Salinas on 8.17.17. If Bridgette needs to be contacted she can be reached at [phone]. Thanks   -------------------------------------------------  I spoke to [place] and advised him that I would consult Dr. Bill Salinas and call him back. Message routed to Dr. Bill Salinas.  Renae Gloss, RN  08/20/2017, 11:16    ---------------------------------------------    Burnis Kingfisher.  Renae Gloss, RN  08/26/2017, 15:31

## 2017-08-28 ENCOUNTER — Ambulatory Visit (INDEPENDENT_AMBULATORY_CARE_PROVIDER_SITE_OTHER): Payer: Self-pay | Admitting: Neurological Surgery

## 2017-08-28 NOTE — Telephone Encounter (Addendum)
Regarding: [name]  ----- Message from [place] sent at 08/28/2017 12:10 PM EST -----  Matthew Sparks    Pt called back and would like to know if he will be seen. He stated that this is not workers comp and needs billed to [facility]. Thanks  --------------------------------------------  I spoke to [name] and he will ask his PCP on Monday about imaging and then will call to schedule an appointment.  Renae Gloss, RN  08/28/2017, 12:52

## 2017-09-03 ENCOUNTER — Ambulatory Visit (INDEPENDENT_AMBULATORY_CARE_PROVIDER_SITE_OTHER): Payer: Self-pay | Admitting: Neurological Surgery

## 2017-09-03 NOTE — Telephone Encounter (Addendum)
Regarding: Matthew Sparks - referral to Neurology  ----- Message from Roland Earl sent at 09/03/2017 11:29 AM EDT -----  Matthew Sparks  Pt asking to be referred to the Neurology clinic for stroke, etc.  States he was kept off his diabetic med for 8 months with nothing in place of it.  Then given meds which caused him to be in hospital.  Pt having issues w/ his head and eyes. Please contact pt to discuss.  Thanks,  -------------------------------------------  I left a voice message for patient to contact his primary care doctor about his head and eye issues and discuss with them about whether he needs a referral or not.  Renae Gloss, RN  09/03/2017, 14:58

## 2017-12-01 ENCOUNTER — Ambulatory Visit (INDEPENDENT_AMBULATORY_CARE_PROVIDER_SITE_OTHER): Payer: Self-pay | Admitting: Internal Medicine

## 2017-12-08 ENCOUNTER — Ambulatory Visit: Payer: Medicaid Other | Attending: Internal Medicine | Admitting: Internal Medicine

## 2017-12-08 ENCOUNTER — Ambulatory Visit (INDEPENDENT_AMBULATORY_CARE_PROVIDER_SITE_OTHER): Payer: Medicaid Other | Admitting: Rheumatology

## 2017-12-08 VITALS — BP 142/82 | HR 81 | Ht 70.0 in | Wt 202.8 lb

## 2017-12-08 DIAGNOSIS — Z8673 Personal history of transient ischemic attack (TIA), and cerebral infarction without residual deficits: Secondary | ICD-10-CM | POA: Insufficient documentation

## 2017-12-08 DIAGNOSIS — I639 Cerebral infarction, unspecified: Secondary | ICD-10-CM

## 2017-12-08 DIAGNOSIS — M549 Dorsalgia, unspecified: Secondary | ICD-10-CM

## 2017-12-08 DIAGNOSIS — Z7982 Long term (current) use of aspirin: Secondary | ICD-10-CM | POA: Insufficient documentation

## 2017-12-08 DIAGNOSIS — G930 Cerebral cysts: Secondary | ICD-10-CM

## 2017-12-08 DIAGNOSIS — Z87898 Personal history of other specified conditions: Secondary | ICD-10-CM | POA: Insufficient documentation

## 2017-12-08 DIAGNOSIS — Z9889 Other specified postprocedural states: Secondary | ICD-10-CM | POA: Insufficient documentation

## 2017-12-08 DIAGNOSIS — R252 Cramp and spasm: Secondary | ICD-10-CM

## 2017-12-08 DIAGNOSIS — G629 Polyneuropathy, unspecified: Secondary | ICD-10-CM | POA: Insufficient documentation

## 2017-12-08 DIAGNOSIS — Z7902 Long term (current) use of antithrombotics/antiplatelets: Secondary | ICD-10-CM | POA: Insufficient documentation

## 2017-12-08 LAB — BASIC METABOLIC PANEL
ANION GAP: 9 mmol/L (ref 4–13)
BUN/CREA RATIO: 9 (ref 6–22)
BUN: 12 mg/dL (ref 8–25)
CALCIUM: 10.1 mg/dL (ref 8.5–10.2)
CHLORIDE: 106 mmol/L (ref 96–111)
CHLORIDE: 106 mmol/L (ref 96–111)
CO2 TOTAL: 29 mmol/L (ref 22–32)
CREATININE: 1.36 mg/dL — ABNORMAL HIGH (ref 0.62–1.27)
ESTIMATED GFR: >59 mL/min/1.73mˆ2 (ref >59)
GLUCOSE: 105 mg/dL (ref 65–139)
GLUCOSE: 105 mg/dL (ref 65–139)
POTASSIUM: 4 mmol/L (ref 3.5–5.1)
SODIUM: 144 mmol/L (ref 136–145)

## 2017-12-08 LAB — CBC
HCT: 45.3 % (ref 38.9–52.0)
HGB: 14.6 g/dL (ref 13.4–17.5)
MCH: 28.9 pg (ref 26.0–32.0)
MCHC: 32.2 g/dL (ref 31.0–35.5)
MCV: 89.5 fL (ref 78.0–100.0)
MPV: 11.3 fL (ref 8.7–12.5)
PLATELETS: 214 x10ˆ3/uL (ref 150–400)
RBC: 5.06 x10ˆ6/uL (ref 4.50–6.10)
RDW-CV: 13.2 % (ref 11.5–15.5)
WBC: 9 x10ˆ3/uL (ref 3.7–11.0)

## 2017-12-08 LAB — SEDIMENTATION RATE: ERYTHROCYTE SEDIMENTATION RATE (ESR): 9 mm/h (ref 0–15)

## 2017-12-08 LAB — VITAMIN B12: VITAMIN B 12: 754 pg/mL (ref 200–1000)

## 2017-12-08 NOTE — Patient Instructions (Addendum)
Please talk to your PCP about your blood pressure as it was elevated today and this will need better control.   Would recommend that you take a baby aspirin (81mg ) since you are also on plavix.     Education:  I did recommend if you ever fell and hit head to go to ED.   Please note that I specifically went over signs of a stroke including but not limited to facial tingling or dropping, sudden onset of arm or leg weakness, speech difficulties such as not being able to speak or slurred speech. I specifically told patient if they were to ever experience this suddenly they should call 911 immediately.   Also if you ever had a seizure of loss of consciousness you would need to call my office immediately at [phone] and refrain from activities like driving if this happened.
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
## 2017-12-17 ENCOUNTER — Ambulatory Visit (INDEPENDENT_AMBULATORY_CARE_PROVIDER_SITE_OTHER): Payer: Self-pay

## 2018-01-06 ENCOUNTER — Encounter (INDEPENDENT_AMBULATORY_CARE_PROVIDER_SITE_OTHER): Payer: Self-pay | Admitting: Neurological Surgery

## 2018-01-06 NOTE — Progress Notes (Signed)
[place] Controlled Substance Full Name Report Report Date 01/06/2018   From 01/06/2017 To 01/05/2018 Date of Birth 01/02/1954 Prescription Count 0   Last Name [place] First Name [place] Name                   Patients included in report that appear to match the search criteria.   Last Name First Name Middle Name Gender Address         Prescriber Name Prescriber [place] Name Monette Zip Rx Written Date Rx Dispense Date  & Date Sold   Rx Number Product Name MEDD Status Strength Qty Days # of Refill Sched Payment Type

## 2018-01-07 ENCOUNTER — Encounter (INDEPENDENT_AMBULATORY_CARE_PROVIDER_SITE_OTHER): Payer: Self-pay | Admitting: Neurological Surgery

## 2018-01-07 ENCOUNTER — Ambulatory Visit: Payer: Medicaid Other | Attending: Neurological Surgery | Admitting: Neurological Surgery

## 2018-01-07 VITALS — BP 133/74 | HR 62 | Temp 97.2°F | Resp 20 | Ht 70.0 in | Wt 202.6 lb

## 2018-01-07 DIAGNOSIS — M79606 Pain in leg, unspecified: Secondary | ICD-10-CM | POA: Insufficient documentation

## 2018-01-07 DIAGNOSIS — M79605 Pain in left leg: Secondary | ICD-10-CM

## 2018-01-07 DIAGNOSIS — M79604 Pain in right leg: Secondary | ICD-10-CM

## 2018-01-07 DIAGNOSIS — M79602 Pain in left arm: Secondary | ICD-10-CM

## 2018-01-07 DIAGNOSIS — M79603 Pain in arm, unspecified: Secondary | ICD-10-CM | POA: Insufficient documentation

## 2018-01-07 DIAGNOSIS — M549 Dorsalgia, unspecified: Secondary | ICD-10-CM | POA: Insufficient documentation

## 2018-01-07 DIAGNOSIS — F17210 Nicotine dependence, cigarettes, uncomplicated: Secondary | ICD-10-CM | POA: Insufficient documentation

## 2018-01-07 DIAGNOSIS — E1140 Type 2 diabetes mellitus with diabetic neuropathy, unspecified: Secondary | ICD-10-CM | POA: Insufficient documentation

## 2018-01-07 DIAGNOSIS — Z6829 Body mass index (BMI) 29.0-29.9, adult: Secondary | ICD-10-CM

## 2018-01-07 DIAGNOSIS — R52 Pain, unspecified: Secondary | ICD-10-CM

## 2018-01-07 DIAGNOSIS — G629 Polyneuropathy, unspecified: Secondary | ICD-10-CM

## 2018-01-07 DIAGNOSIS — G8929 Other chronic pain: Secondary | ICD-10-CM | POA: Insufficient documentation

## 2018-01-07 DIAGNOSIS — R109 Unspecified abdominal pain: Secondary | ICD-10-CM | POA: Insufficient documentation

## 2018-01-07 MED ORDER — LIDOCAINE 5 % TOPICAL PATCH
1.0000 | MEDICATED_PATCH | Freq: Every day | CUTANEOUS | 1 refills | Status: DC
Start: 2018-01-07 — End: 2022-01-02
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
## 2018-01-08 ENCOUNTER — Telehealth (INDEPENDENT_AMBULATORY_CARE_PROVIDER_SITE_OTHER): Payer: Self-pay | Admitting: Neurological Surgery

## 2018-01-08 NOTE — Telephone Encounter (Signed)
CM spoke w/ patient to schedule for evaluation for  marinol and possibly tramadol for 01/21/18.     01/08/2018  Takyah Ciaramitaro, CASE MANAGER  01/08/2018, 09:10

## 2018-01-12 ENCOUNTER — Ambulatory Visit (HOSPITAL_BASED_OUTPATIENT_CLINIC_OR_DEPARTMENT_OTHER)
Admission: RE | Admit: 2018-01-12 | Discharge: 2018-01-12 | Disposition: A | Discharge status: Home / Self Care | Payer: Medicaid Other | Source: Ambulatory Visit

## 2018-01-12 ENCOUNTER — Ambulatory Visit
Admission: RE | Admit: 2018-01-12 | Discharge: 2018-01-12 | Disposition: A | Discharge status: Home / Self Care | Payer: Medicaid Other | Source: Ambulatory Visit | Attending: Internal Medicine | Admitting: Internal Medicine

## 2018-01-12 ENCOUNTER — Ambulatory Visit (INDEPENDENT_AMBULATORY_CARE_PROVIDER_SITE_OTHER)
Admission: RE | Admit: 2018-01-12 | Discharge: 2018-01-12 | Disposition: A | Discharge status: Home / Self Care | Payer: Medicaid Other | Source: Ambulatory Visit

## 2018-01-12 DIAGNOSIS — I6389 Other cerebral infarction: Secondary | ICD-10-CM

## 2018-01-12 DIAGNOSIS — I639 Cerebral infarction, unspecified: Secondary | ICD-10-CM

## 2018-01-12 DIAGNOSIS — R252 Cramp and spasm: Secondary | ICD-10-CM | POA: Insufficient documentation

## 2018-01-12 MED ORDER — GADOBUTROL 10 MMOL/10 ML (1 MMOL/ML) INTRAVENOUS SOLUTION
9.00 mL | INTRAVENOUS | Status: AC
Start: 2018-01-12 — End: 2018-01-12
  Administered 2018-01-12: 16:00:00 9 mL via INTRAVENOUS

## 2018-01-12 MED ADMIN — nystatin 100,000 unit/gram topical powder: INTRAVENOUS | @ 16:00:00 | NDC 00574200815

## 2018-01-12 NOTE — Procedures (Signed)
NAME:  Matthew Sparks, Matthew Sparks NUMBER:  [id number]  DATE OF SERVICE:  01/12/2018  DOB:  1955/09/12  SEX:  M      Date of study:  January 12, 2018, 12:46 p.m. to 1:01 p.m.     EEG #:  0600.  Technician:  Luster Landsberg.    REQUESTING PHYSICIAN:  Vista Lawman, MD.    REPORT:  A digitally acquired routine EEG with electrode placement according to the 10-20 system.  Background was continuous, symmetric, with a posterior dominant rhythm of 9.5Hz .  Background was composed of alpha and beta and theta activity.  Stage N2 sleep was evident by sleep spindles.  No epileptogenic discharges, seizures or clinical spells.  Photic stimulation was performed and elicited no abnormality.  Hyperventilation was omitted due to comorbidity.    INTERPRETATION:  Normal awake and asleep EEG. Normal EEG does not rule out seizures. Clinical correlation recommended.         Greggory Brandy, MD    Janyce Llanos, MD  Assistant Professor   [facility]          DD:  01/12/2018 15:23:37  DT:  01/12/2018 16:31:27 JB  D#:  [id number]    I have reviewed the EEG and agree with the findings of this report. Any additions, exceptions or clarifications are noted .      Janyce Llanos, MD

## 2018-01-13 LAB — CAROTID ARTERY DUPLEX
Left CCA dist dias: 12 cm/s
Left CCA dist sys: 97 cm/s
Left CCA mid dias: 15
Left CCA mid dias: 17
Left CCA mid sys: 108
Left CCA prox dias: 13 cm/s
Left CCA prox sys: 91 cm/s
Left Carotid Bubl Sys: 57
Left Carotid Bulb Dias: 8
Left ECA sys: 107 cm/s
Left ICA dist dias: 17 cm/s
Left ICA dist sys: 50 cm/s
Left ICA mid dias: 17 cm/s
Left ICA mid dias: 17 cm/s
Left ICA mid sys: 46 cm/s
Left ICA prox dias: 8 cm/s
Left ICA prox sys: 25 cm/s
Left ICA/CCA sys: 0.6
Left vertebral sys: 79 cm/s
Right CCA dist dias: 18 cm/s
Right CCA mid sys: 75
Right CCA prox dias: 14 cm/s
Right CCA prox sys: 75 cm/s
Right Carotid Bulb Dias: 14
Right Carotid Bulb Sys: 63
Right Carotid Bulb Sys: 63
Right ICA dist dias: 24 cm/s
Right ICA dist sys: 63 cm/s
Right ICA mid dias: 14 cm/s
Right ICA mid sys: 54 cm/s
Right ICA prox dias: 14 cm/s
Right ICA prox sys: 54 cm/s
Right ICA/CCA sys: 0.9 cm/s
Right cca dist sys: 74 cm/s
Right eca sys: 138 cm/s
Right vertebral sys: 37 cm/s

## 2018-01-19 ENCOUNTER — Ambulatory Visit (INDEPENDENT_AMBULATORY_CARE_PROVIDER_SITE_OTHER): Payer: Self-pay | Admitting: Internal Medicine

## 2018-01-19 NOTE — Telephone Encounter (Signed)
Pt asking to talk to you

## 2018-01-19 NOTE — Telephone Encounter (Signed)
Matthew Lawman, MD  Luana Shu, Lorie   Caller: Unspecified (Today, 10:50 AM)            I called him back at 11:23am. I spoke to him and he says that he was feeling a bad tightening in his head and so I recommended if it was bad he could go to nearest ED to get evaluated.     Annamaria Boots

## 2018-01-19 NOTE — Telephone Encounter (Signed)
-----   Message from Sarina Ser sent at 01/19/2018 10:54 AM EDT -----  Pt is asking to speak with Dr Mathis Fare. He states that there is something wrong with his head, it feels tight around his eyes. Pt is asking for a call back asap at [phone] to advise. Thanks

## 2018-01-20 NOTE — Telephone Encounter (Signed)
Regarding: [name] - Sx/ MRI to chart  ----- Message from Roland Earl sent at 01/20/2018  2:22 PM EDT -----  Matthew Sparks  Pt asking if you can make his MRI available on [email or web address]   Pt states there's something wrong w/ his head - had a difficult time explaining himself, but feels a warm feeling and some twitching.  Please contact to discuss.  Thanks,

## 2018-01-20 NOTE — Telephone Encounter (Signed)
Please advise 

## 2018-01-21 ENCOUNTER — Ambulatory Visit (INDEPENDENT_AMBULATORY_CARE_PROVIDER_SITE_OTHER): Payer: Medicaid Other | Admitting: Psychologist

## 2018-01-21 DIAGNOSIS — F459 Somatoform disorder, unspecified: Principal | ICD-10-CM

## 2018-01-21 DIAGNOSIS — G629 Polyneuropathy, unspecified: Secondary | ICD-10-CM

## 2018-01-21 DIAGNOSIS — F39 Unspecified mood [affective] disorder: Secondary | ICD-10-CM

## 2018-01-21 DIAGNOSIS — F09 Unspecified mental disorder due to known physiological condition: Secondary | ICD-10-CM

## 2018-01-21 DIAGNOSIS — F451 Undifferentiated somatoform disorder: Secondary | ICD-10-CM

## 2018-01-21 DIAGNOSIS — G8929 Other chronic pain: Secondary | ICD-10-CM
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
## 2018-01-21 NOTE — Telephone Encounter (Signed)
Vista Lawman, MD                I personally reviewed the official read of his MRI done 01/12/18 and personally reviewed the images and there were not acute findings. I called him back this evening at 5:59pm but no one picked up.     Thank you,     Annamaria Boots

## 2018-01-25 ENCOUNTER — Ambulatory Visit (INDEPENDENT_AMBULATORY_CARE_PROVIDER_SITE_OTHER): Payer: Self-pay | Admitting: Internal Medicine

## 2018-01-25 NOTE — Telephone Encounter (Signed)
Please advise  I did not see a referral

## 2018-01-25 NOTE — Telephone Encounter (Signed)
Regarding: [name]  ----- Message from Mayme Genta sent at 01/25/2018  2:38 PM EDT -----  Mathis Fare - pt is calling to see if you are going to referral in to see Dr. Bill Salinas.  Please call.    Thanks,  Butch Penny

## 2018-02-05 ENCOUNTER — Encounter (INDEPENDENT_AMBULATORY_CARE_PROVIDER_SITE_OTHER): Payer: Self-pay | Admitting: Neurological Surgery

## 2018-03-16 ENCOUNTER — Encounter (INDEPENDENT_AMBULATORY_CARE_PROVIDER_SITE_OTHER): Payer: Self-pay | Admitting: Internal Medicine

## 2019-02-14 ENCOUNTER — Other Ambulatory Visit (HOSPITAL_COMMUNITY): Payer: Self-pay | Admitting: NEUROSURGERY

## 2019-02-14 DIAGNOSIS — M4727 Other spondylosis with radiculopathy, lumbosacral region: Secondary | ICD-10-CM

## 2019-03-03 ENCOUNTER — Ambulatory Visit (HOSPITAL_COMMUNITY): Payer: Medicare Other | Admitting: Radiology

## 2019-03-04 ENCOUNTER — Other Ambulatory Visit: Payer: Self-pay

## 2019-03-14 ENCOUNTER — Other Ambulatory Visit: Payer: Self-pay

## 2019-08-14 ENCOUNTER — Other Ambulatory Visit: Payer: Self-pay

## 2020-07-16 ENCOUNTER — Other Ambulatory Visit: Payer: Self-pay

## 2021-08-19 ENCOUNTER — Encounter (INDEPENDENT_AMBULATORY_CARE_PROVIDER_SITE_OTHER): Payer: Self-pay | Admitting: Nephrology

## 2021-08-19 DIAGNOSIS — N1831 Chronic kidney disease, stage 3a (CMS HCC): Secondary | ICD-10-CM | POA: Insufficient documentation

## 2021-08-19 DIAGNOSIS — I10 Essential (primary) hypertension: Secondary | ICD-10-CM

## 2021-08-19 DIAGNOSIS — E119 Type 2 diabetes mellitus without complications: Secondary | ICD-10-CM | POA: Insufficient documentation

## 2021-08-19 DIAGNOSIS — E1122 Type 2 diabetes mellitus with diabetic chronic kidney disease: Secondary | ICD-10-CM | POA: Insufficient documentation

## 2021-08-19 NOTE — Progress Notes (Signed)
66 year old gentleman with past medical history of diabetes, hypertension on multiple BP meds, diabetic nephropathy and CKD stage III here to establish care.  Patient denies nausea or vomiting.  Patient denies fevers or chills.  He reports her blood sugars are not very well controlled at the moment.  Patient reports his blood pressure is under good control.  Patient reports recent injury to his back from his work and he has decrease in activity and he is gaining weight.  Patient denies edema denies shortness of breath.      Chronic kidney disease  -Stage IIIa  -Due to diabetes and hypertension  -Creatinine is stable 1.4/ gfr 52  -Baseline creatinine 1.5  -We will check UA  -Total protein to creatinine ratio  -Albuminuria +  -CBC and a basic metabolic panel  -Check renal ultrasound  -Return to clinic in 3 months  -Continue low-sodium diet  -Fluid restriction less than 40 ounces a day  -Avoid NSAIDs    CKD bone mineral disease  -Check PTH  -Vitamin D level      Hypertension  -Blood pressure is at goal  -Goal less than 130/80  -Continue losartan/chlorthalidone/metoprolol/amlodipine  -Low-salt diet      Diabetes type 2  -A1c goal is less than 7  -Continue current medications  -Diabetic diet  -Increase activity and exercise as possible  -On losartan  -may add  [place]

## 2021-08-26 ENCOUNTER — Other Ambulatory Visit (INDEPENDENT_AMBULATORY_CARE_PROVIDER_SITE_OTHER): Payer: Self-pay | Admitting: Nephrology

## 2021-08-26 DIAGNOSIS — E559 Vitamin D deficiency, unspecified: Secondary | ICD-10-CM

## 2021-08-26 DIAGNOSIS — N189 Chronic kidney disease, unspecified: Secondary | ICD-10-CM

## 2021-09-02 ENCOUNTER — Other Ambulatory Visit (INDEPENDENT_AMBULATORY_CARE_PROVIDER_SITE_OTHER): Payer: Self-pay | Admitting: Nephrology

## 2021-09-02 DIAGNOSIS — N1831 Chronic kidney disease, stage 3a (CMS HCC): Secondary | ICD-10-CM

## 2021-09-04 ENCOUNTER — Encounter (INDEPENDENT_AMBULATORY_CARE_PROVIDER_SITE_OTHER): Payer: Self-pay | Admitting: Nephrology

## 2021-09-18 ENCOUNTER — Other Ambulatory Visit: Payer: Self-pay

## 2021-09-18 ENCOUNTER — Other Ambulatory Visit (HOSPITAL_COMMUNITY): Payer: Medicare Other

## 2021-09-18 ENCOUNTER — Other Ambulatory Visit (INDEPENDENT_AMBULATORY_CARE_PROVIDER_SITE_OTHER): Payer: Self-pay | Admitting: Nephrology

## 2021-09-18 ENCOUNTER — Inpatient Hospital Stay
Admission: RE | Admit: 2021-09-18 | Discharge: 2021-09-18 | Disposition: A | Discharge status: Home / Self Care | Payer: Medicare Other | Source: Ambulatory Visit | Attending: Nephrology | Admitting: Nephrology

## 2021-09-18 DIAGNOSIS — E559 Vitamin D deficiency, unspecified: Secondary | ICD-10-CM | POA: Insufficient documentation

## 2021-09-18 DIAGNOSIS — N189 Chronic kidney disease, unspecified: Secondary | ICD-10-CM

## 2021-09-18 DIAGNOSIS — N1831 Chronic kidney disease, stage 3a (CMS HCC): Secondary | ICD-10-CM

## 2021-09-18 LAB — CBC WITH DIFF
BASOPHIL #: 0 10*3/uL (ref 0.00–2.50)
BASOPHIL %: 1 % (ref 0–3)
EOSINOPHIL #: 0.1 10*3/uL (ref 0.00–2.40)
EOSINOPHIL %: 2 % (ref 0–7)
HCT: 37.1 % — ABNORMAL LOW (ref 42.0–51.0)
HGB: 12.3 g/dL — ABNORMAL LOW (ref 13.5–18.0)
LYMPHOCYTE #: 1.7 10*3/uL — ABNORMAL LOW (ref 2.10–11.00)
LYMPHOCYTE %: 27 % (ref 25–45)
MCH: 29.4 pg (ref 27.0–32.0)
MCHC: 33.3 g/dL (ref 32.0–36.0)
MCV: 88.4 fL (ref 78.0–99.0)
MONOCYTE #: 0.6 10*3/uL (ref 0.00–4.10)
MONOCYTE %: 9 % (ref 0–12)
MPV: 10 fL (ref 7.4–10.4)
NEUTROPHIL #: 3.9 10*3/uL — ABNORMAL LOW (ref 4.10–29.00)
NEUTROPHIL %: 62 % (ref 40–76)
PLATELETS: 161 10*3/uL (ref 140–440)
RBC: 4.2 10*6/uL (ref 4.20–6.00)
RDW: 14.2 % (ref 11.6–14.8)
WBC: 6.4 10*3/uL (ref 4.0–10.5)
WBCS UNCORRECTED: 6.4 10*3/uL

## 2021-09-18 LAB — PROTEIN/CREATININE RATIO, URINE, RANDOM
CREATININE RANDOM URINE: 192 mg/dL — ABNORMAL HIGH (ref 11–26)
PROTEIN RANDOM URINE: 44 mg/dL — ABNORMAL LOW (ref 50–80)
PROTEIN/CREATININE RATIO RANDOM URINE: 0.229 mg/mg (ref 0.000–200.000)

## 2021-09-18 LAB — MICROALBUMIN URINE, RANDOM: MICROALBUMIN RANDOM URINE: 29.4 mg/dL

## 2021-09-18 LAB — BASIC METABOLIC PANEL
ANION GAP: 7 mmol/L — ABNORMAL LOW (ref 10–20)
BUN/CREA RATIO: 14 (ref 6–22)
BUN: 24 mg/dL (ref 7–25)
CALCIUM: 9.1 mg/dL (ref 8.6–10.3)
CHLORIDE: 109 mmol/L — ABNORMAL HIGH (ref 98–107)
CO2 TOTAL: 25 mmol/L (ref 21–31)
CREATININE: 1.73 mg/dL — ABNORMAL HIGH (ref 0.60–1.30)
ESTIMATED GFR: 43 mL/min/{1.73_m2} — ABNORMAL LOW (ref >59)
GLUCOSE: 80 mg/dL (ref 74–109)
OSMOLALITY, CALCULATED: 284 mosm/kg (ref 270–290)
POTASSIUM: 3.8 mmol/L (ref 3.5–5.1)
SODIUM: 141 mmol/L (ref 136–145)

## 2021-09-18 LAB — PARATHYROID HORMONE (PTH): PTH: 79.3 pg/mL (ref 12.0–88.0)

## 2021-09-18 LAB — VITAMIN D 25 TOTAL: VITAMIN D: 47 ng/mL (ref 30–100)

## 2021-09-21 LAB — VITAMIN D CALCITROL, 1,25 HYDROXYVITAMIN D
VITAMIN D,1,25 (OH)2,TOTAL: 24 pg/mL (ref 18–72)
VITAMIN D2, 1,25 (OH)2: 24 pg/mL
VITAMIN D3, 1,25 (OH)2: <8 pg/mL

## 2021-09-26 ENCOUNTER — Encounter (INDEPENDENT_AMBULATORY_CARE_PROVIDER_SITE_OTHER): Payer: Self-pay | Admitting: Nephrology

## 2021-09-26 ENCOUNTER — Other Ambulatory Visit: Payer: Self-pay

## 2021-09-26 ENCOUNTER — Ambulatory Visit (INDEPENDENT_AMBULATORY_CARE_PROVIDER_SITE_OTHER): Payer: Medicare Other | Admitting: Nephrology

## 2021-09-26 VITALS — BP 142/68 | HR 90 | Ht 70.0 in | Wt 217.0 lb

## 2021-09-26 DIAGNOSIS — E1121 Type 2 diabetes mellitus with diabetic nephropathy: Secondary | ICD-10-CM

## 2021-09-26 DIAGNOSIS — I10 Essential (primary) hypertension: Secondary | ICD-10-CM

## 2021-09-26 DIAGNOSIS — N1832 Chronic kidney disease, stage 3b (CMS HCC): Secondary | ICD-10-CM

## 2021-09-26 DIAGNOSIS — I129 Hypertensive chronic kidney disease with stage 1 through stage 4 chronic kidney disease, or unspecified chronic kidney disease: Secondary | ICD-10-CM

## 2021-09-26 MED ORDER — DAPAGLIFLOZIN PROPANEDIOL 10 MG TABLET
10.0000 mg | ORAL_TABLET | Freq: Every day | ORAL | 3 refills | Status: DC
Start: 2021-09-26 — End: 2022-01-02

## 2021-09-26 NOTE — Progress Notes (Signed)
[place]    Progress Note    Name: Matthew Sparks MRN:  [id number]   Date: 09/26/2021 Age: 66 y.o.          Nephrology Follow Up Visit        HPI: 66 y.o.   67 year old gentleman with past medical history of diabetes, hypertension on multiple BP meds, diabetic nephropathy and CKD stage III here to establish care.  Patient denies nausea or vomiting.  Patient denies fevers or chills.  He reports his blood sugars are are better.   Patient reports his blood pressure is under good control.  Patient continues to complain of left lower back pain and wants to see a neurosurgeon in the [place] the country.  Patient takes Tylenol only for that.  Patient denies edema or dysuria today.  Discussed with him the findings his lab work and renal ultrasound.  ROS:         Systematic review of 12 organ systems was negative except what mentioned in in the HPI.      OBJECTIVE:   BP (!) 142/68 (Site: Upper Extremity, Patient Position: Sitting, Cuff Size: Adult)   Pulse 90   Ht 1.778 m ('5\' 10"'$ )   Wt 98.4 kg (217 lb)   BMI 31.14 kg/m       General:  NAD, AAOx3  HEENT:  EOMI, MMM, no pallor, no icterus  NECK: No increased JVD.    HEART: Normal S1 and S2. Regular rhythm. No murmurs or rubs.   LUNGS: Clear to auscultation bilateral. No wheezes, rales, or rhonchi.   ABDOMEN: +BS, Soft, nontender and nondistended. No rebound or guarding present.   EXTREMITIES: No edema. No asterixis    NEURO : moving all extremities. emoi  SKIN: No obvious skin rashes.    LABORATORY DATA:   Lab Results   Component Value Date    BUN 24 09/18/2021    BUN 12 12/08/2017    CREATININE 1.73 (H) 09/18/2021    CREATININE 1.36 (H) 12/08/2017    BUNCRRATIO 14 09/18/2021    BUNCRRATIO 9 12/08/2017    GFR 43 (L) 09/18/2021    GFR >59 12/08/2017    SODIUM 141 09/18/2021    SODIUM 144 12/08/2017    POTASSIUM 3.8 09/18/2021    POTASSIUM 4.0 12/08/2017    CHLORIDE 109 (H) 09/18/2021    CHLORIDE 106 12/08/2017    CO2 25  09/18/2021    CO2 29 12/08/2017    ANIONGAP 7 (L) 09/18/2021    ANIONGAP 9 12/08/2017    CALCIUM 9.1 09/18/2021    CALCIUM 10.1 12/08/2017    HGB 12.3 (L) 09/18/2021    HGB 14.6 12/08/2017    HCT 37.1 (L) 09/18/2021    HCT 45.3 12/08/2017    INTACTPTH 79.3 09/18/2021       No results found for: MICROALBUMIN, TOTPROTCREAT, HA1C, URICACID         MEDICATIONS:  No outpatient medications have been marked as taking for the 09/26/21 encounter (Office Visit) with Beather Arbour, MD.     Current Outpatient Medications   Medication Instructions   . albuterol (PROVENTIL) 2.5 mg, Nebulization, 3 TIMES DAILY   . albuterol sulfate (PROVENTIL OR VENTOLIN OR PROAIR) 90 mcg/actuation Inhalation HFA Aerosol Inhaler 1-2 Puffs, Inhalation, EVERY 6 HOURS PRN   . amLODIPine (NORVASC) 10 mg, Oral, DAILY   . aspirin 81 mg, Oral, DAILY   . atorvastatin (LIPITOR) 80 mg, Oral, EVERY EVENING   .  benzonatate (TESSALON) 100 mg, 3 TIMES DAILY   . clopidogreL (PLAVIX) 75 mg, Oral, DAILY   . DULoxetine (CYMBALTA DR) 30 mg, Oral, DAILY   . ergocalciferol (vitamin D2) (DRISDOL) 50,000 Units, Oral, EVERY 7 DAYS   . ezetimibe (ZETIA) 10 mg, Oral, EVERY EVENING   . fenofibrate (LOFIBRA) 160 mg Oral Tablet No dose, route, or frequency recorded.   . hydroCHLOROthiazide (MICROZIDE) 12.5 mg, Oral, DAILY   . indapamide (LOZOL) 1.25 mg Oral Tablet No dose, route, or frequency recorded.   . insulin glargine 30 Units, NIGHTLY   . isosorbide mononitrate (IMDUR) 30 mg Oral Tablet Sustained Release 24 hr No dose, route, or frequency recorded.   . lidocaine (LIDODERM) 700 mg, Transdermal, DAILY   . losartan (COZAAR) 100 mg, Oral, DAILY   . magnesium oxide (MAG-OX) 400 mg Oral Tablet No dose, route, or frequency recorded.   . metFORMIN (GLUCOPHAGE) 500 mg, Oral, 2 TIMES DAILY WITH FOOD   . metoprolol succinate (TOPROL-XL) 100 mg, Oral, DAILY   . montelukast (SINGULAIR) 10 mg, Oral, EVERY EVENING   . naproxen sodium (ALEVE) 220 mg, Oral, DAILY   . nitroGLYCERIN  (NITROSTAT) 0.4 mg Sublingual Tablet, Sublingual No dose, route, or frequency recorded.   . OMEGA-3 FATTY ACIDS (FISH OIL ORAL) Oral, DAILY   . polyethylene glycol (MIRALAX) 17 gram/dose Oral Powder No dose, route, or frequency recorded.   . pravastatin (PRAVACHOL) 80 mg, Oral, EVERY EVENING   . traMADoL (ULTRAM) 50 mg, Oral, EVERY 6 HOURS PRN   . venlafaxine (EFFEXOR XR) 37.5 mg, Oral, DAILY         ASSESSMENT / PLAN:   No diagnosis found.                  Chronic kidney disease  -Stage IIIB  -Due to diabetes and hypertension  -Creatinine is stable CREATININE 1.7-Baseline creatinine 1.5  -We will check UA  -Total protein to creatinine ratio:  Not significant  -Albuminuria +  -CBC and a basic metabolic panel  -no hydronephrosis or renal masses on renal ultrasound  -Return to clinic in 3 months  -Continue low-sodium diet  -Fluid restriction less than 40 ounces a day  -Avoid NSAIDs  -add Wilder Glade, discussed with him the risks and benefits  -continue losartan    CKD bone mineral disease  -Check PTH  -Vitamin D level  -at goal      Hypertension  -Blood pressure is at acceptable  -Goal less than 130/80  -Continue losartan/chlorthalidone/metoprolol/amlodipine  -Low-salt diet      Diabetes type 2  -A1c goal is less than 7  -Continue current medications  -Diabetic diet  -Increase activity and exercise as possible  -On losartan  -add  [place]

## 2021-12-26 ENCOUNTER — Ambulatory Visit (INDEPENDENT_AMBULATORY_CARE_PROVIDER_SITE_OTHER): Payer: Medicare Other

## 2021-12-26 ENCOUNTER — Other Ambulatory Visit: Payer: Medicare Other | Attending: Nephrology | Admitting: Nephrology

## 2021-12-26 ENCOUNTER — Other Ambulatory Visit: Payer: Self-pay

## 2021-12-26 DIAGNOSIS — N1832 Chronic kidney disease, stage 3b (CMS HCC): Secondary | ICD-10-CM

## 2021-12-26 DIAGNOSIS — E1121 Type 2 diabetes mellitus with diabetic nephropathy: Secondary | ICD-10-CM | POA: Insufficient documentation

## 2021-12-26 DIAGNOSIS — I129 Hypertensive chronic kidney disease with stage 1 through stage 4 chronic kidney disease, or unspecified chronic kidney disease: Secondary | ICD-10-CM

## 2021-12-26 DIAGNOSIS — I10 Essential (primary) hypertension: Secondary | ICD-10-CM

## 2021-12-26 LAB — PROTEIN/CREATININE RATIO, URINE, RANDOM
CREATININE RANDOM URINE: 199 mg/dL — ABNORMAL HIGH (ref 11–26)
PROTEIN RANDOM URINE: 15 mg/dL — ABNORMAL LOW (ref 50–80)
PROTEIN/CREATININE RATIO RANDOM URINE: 0.075 mg/mg (ref 0.000–200.000)

## 2021-12-26 LAB — BASIC METABOLIC PANEL
ANION GAP: 8 mmol/L — ABNORMAL LOW (ref 10–20)
BUN/CREA RATIO: 20 (ref 6–22)
BUN: 37 mg/dL — ABNORMAL HIGH (ref 7–25)
CALCIUM: 9.8 mg/dL (ref 8.6–10.3)
CHLORIDE: 105 mmol/L (ref 98–107)
CO2 TOTAL: 26 mmol/L (ref 21–31)
CREATININE: 1.88 mg/dL — ABNORMAL HIGH (ref 0.60–1.30)
ESTIMATED GFR: 39 mL/min/{1.73_m2} — ABNORMAL LOW (ref >59)
GLUCOSE: 131 mg/dL — ABNORMAL HIGH (ref 74–109)
OSMOLALITY, CALCULATED: 288 mOsm/kg (ref 270–290)
POTASSIUM: 3.8 mmol/L (ref 3.5–5.1)
SODIUM: 139 mmol/L (ref 136–145)

## 2021-12-26 LAB — CBC WITH DIFF
BASOPHIL #: 0 10*3/uL (ref 0.00–0.30)
BASOPHIL %: 1 % (ref 0–3)
EOSINOPHIL #: 0.2 10*3/uL (ref 0.00–0.80)
EOSINOPHIL %: 2 % (ref 0–7)
HCT: 37.5 % — ABNORMAL LOW (ref 42.0–51.0)
HGB: 12.3 g/dL — ABNORMAL LOW (ref 13.5–18.0)
LYMPHOCYTE #: 1.9 10*3/uL (ref 1.10–5.00)
LYMPHOCYTE %: 26 % (ref 25–45)
MCH: 29.3 pg (ref 27.0–32.0)
MCHC: 32.8 g/dL (ref 32.0–36.0)
MCV: 89.1 fL (ref 78.0–99.0)
MONOCYTE #: 0.7 10*3/uL (ref 0.00–1.30)
MONOCYTE %: 9 % (ref 0–12)
MPV: 10.2 fL (ref 7.4–10.4)
NEUTROPHIL #: 4.6 10*3/uL (ref 1.80–8.40)
NEUTROPHIL %: 62 % (ref 40–76)
PLATELETS: 164 10*3/uL (ref 140–440)
RBC: 4.21 10*6/uL (ref 4.20–6.00)
RDW: 13.7 % (ref 11.6–14.8)
WBC: 7.4 10*3/uL (ref 4.0–10.5)
WBCS UNCORRECTED: 7.4 10*3/uL

## 2021-12-26 LAB — PARATHYROID HORMONE (PTH): PTH: 42.3 pg/mL (ref 12.0–88.0)

## 2021-12-26 LAB — MICROALBUMIN URINE, RANDOM: MICROALBUMIN RANDOM URINE: 6.3 mg/dL

## 2021-12-26 LAB — HGA1C (HEMOGLOBIN A1C WITH EST AVG GLUCOSE): HEMOGLOBIN A1C: 7.1 % — ABNORMAL HIGH (ref 4.0–6.0)

## 2021-12-26 LAB — VITAMIN D 25 TOTAL: VITAMIN D: 52 ng/mL (ref 30–100)

## 2022-01-02 ENCOUNTER — Ambulatory Visit (INDEPENDENT_AMBULATORY_CARE_PROVIDER_SITE_OTHER): Payer: Medicare Other | Admitting: Nephrology

## 2022-01-02 ENCOUNTER — Encounter (INDEPENDENT_AMBULATORY_CARE_PROVIDER_SITE_OTHER): Payer: Self-pay | Admitting: Nephrology

## 2022-01-02 ENCOUNTER — Other Ambulatory Visit: Payer: Self-pay

## 2022-01-02 VITALS — BP 136/57 | HR 74 | Ht 70.0 in | Wt 215.0 lb

## 2022-01-02 DIAGNOSIS — I10 Essential (primary) hypertension: Secondary | ICD-10-CM

## 2022-01-02 DIAGNOSIS — I129 Hypertensive chronic kidney disease with stage 1 through stage 4 chronic kidney disease, or unspecified chronic kidney disease: Secondary | ICD-10-CM

## 2022-01-02 DIAGNOSIS — N1832 Chronic kidney disease, stage 3b (CMS HCC): Secondary | ICD-10-CM

## 2022-01-02 DIAGNOSIS — E1122 Type 2 diabetes mellitus with diabetic chronic kidney disease: Secondary | ICD-10-CM

## 2022-01-02 DIAGNOSIS — E1121 Type 2 diabetes mellitus with diabetic nephropathy: Secondary | ICD-10-CM

## 2022-01-02 MED ORDER — DAPAGLIFLOZIN PROPANEDIOL 10 MG TABLET
10.0000 mg | ORAL_TABLET | Freq: Every day | ORAL | 3 refills | Status: DC
Start: 2022-01-02 — End: 2022-10-16

## 2022-01-02 NOTE — Progress Notes (Signed)
[place]    Progress Note    Name: Matthew Sparks MRN:  [id number]   Date: 01/02/2022 Age: 66 y.o.     Nephrology Follow Up Visit        HPI: 66 y.o. gentleman with past medical history of diabetes, hypertension on multiple BP meds, diabetic nephropathy and CKD stage III here to establish care.  Patient denies nausea or vomiting.  Patient denies fevers or chills.  He reports his blood sugars are are better.   Patient reports his blood pressure is under good control.      Patient takes Tylenol only for that.  Patient is trying to lose weight.  He is cutting down on his carbohydrate not requiring insulin or Ozempic.  Patient reports some peripheral neuropathy symptoms.  Patient denies any other complaints.  ROS:         Systematic review of 12 organ systems was negative except what mentioned in in the HPI.      OBJECTIVE:   BP (!) 136/57 (Site: Upper Extremity, Patient Position: Sitting)   Pulse 74   Ht 1.778 m ('5\' 10"'$ )   Wt 97.5 kg (215 lb)   BMI 30.85 kg/m       General:  NAD, AAOx3  HEENT:  EOMI, MMM, no pallor, no icterus  NECK: No increased JVD.    HEART: Normal S1 and S2. Regular rhythm. No murmurs or rubs.   LUNGS: Clear to auscultation bilateral. No wheezes, rales, or rhonchi.   ABDOMEN: +BS, Soft, nontender and nondistended. No rebound or guarding present.   EXTREMITIES: No edema. No asterixis    NEURO : moving all extremities. emoi  SKIN: No obvious skin rashes.    LABORATORY DATA:   Lab Results   Component Value Date    BUN 37 (H) 12/26/2021    BUN 24 09/18/2021    BUN 12 12/08/2017    CREATININE 1.88 (H) 12/26/2021    CREATININE 1.73 (H) 09/18/2021    CREATININE 1.36 (H) 12/08/2017    BUNCRRATIO 20 12/26/2021    BUNCRRATIO 14 09/18/2021    BUNCRRATIO 9 12/08/2017    GFR 39 (L) 12/26/2021    GFR 43 (L) 09/18/2021    GFR >59 12/08/2017    SODIUM 139 12/26/2021    SODIUM 141 09/18/2021    SODIUM 144 12/08/2017    POTASSIUM 3.8 12/26/2021    POTASSIUM 3.8  09/18/2021    POTASSIUM 4.0 12/08/2017    CHLORIDE 105 12/26/2021    CHLORIDE 109 (H) 09/18/2021    CHLORIDE 106 12/08/2017    CO2 26 12/26/2021    CO2 25 09/18/2021    CO2 29 12/08/2017    ANIONGAP 8 (L) 12/26/2021    ANIONGAP 7 (L) 09/18/2021    ANIONGAP 9 12/08/2017    CALCIUM 9.8 12/26/2021    CALCIUM 9.1 09/18/2021    CALCIUM 10.1 12/08/2017    HGB 12.3 (L) 12/26/2021    HGB 12.3 (L) 09/18/2021    HGB 14.6 12/08/2017    HCT 37.5 (L) 12/26/2021    HCT 37.1 (L) 09/18/2021    HCT 45.3 12/08/2017    INTACTPTH 42.3 12/26/2021    INTACTPTH 79.3 09/18/2021       Lab Results   Component Value Date    HA1C 7.1 (H) 12/26/2021            MEDICATIONS:  Outpatient Medications Marked as Taking for the 01/02/22 encounter (Office Visit) with Beather Arbour, MD  Medication Sig   . dapagliflozin propanediol (FARXIGA) 10 mg Oral Tablet Take 1 Tablet (10 mg total) by mouth Once a day Indications: chronic kidney disease with albuminuria     Current Outpatient Medications   Medication Instructions   . albuterol (PROVENTIL) 2.5 mg, Nebulization, 3 TIMES DAILY   . albuterol sulfate (PROVENTIL OR VENTOLIN OR PROAIR) 90 mcg/actuation Inhalation HFA Aerosol Inhaler 1-2 Puffs, Inhalation, EVERY 6 HOURS PRN   . amLODIPine (NORVASC) 10 mg, Oral, DAILY   . aspirin 81 mg, Oral, DAILY   . atorvastatin (LIPITOR) 80 mg, Oral, EVERY EVENING   . clopidogreL (PLAVIX) 75 mg, Oral, DAILY   . dapagliflozin propanediol (FARXIGA) 10 mg, Oral, DAILY   . ergocalciferol (vitamin D2) (DRISDOL) 50,000 Units, Oral, EVERY 7 DAYS   . ezetimibe (ZETIA) 10 mg, Oral, EVERY EVENING   . fenofibrate (LOFIBRA) 160 mg Oral Tablet No dose, route, or frequency recorded.   . magnesium oxide (MAG-OX) 400 mg Oral Tablet No dose, route, or frequency recorded.         ASSESSMENT / PLAN:   ENCOUNTER DIAGNOSES     ICD-10-CM   1. 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 Type 2 diabetes mellitus with diabetic nephropathy, without long-term current use of insulin (CMS  HCC)  E11.21   3. Essential hypertension  I10           Chronic kidney disease  -Stage IIIB  -Due to diabetes and hypertension  -Creatinine is stable 1.88  -Baseline creatinine 1.5-1.7UA  -Total protein to creatinine ratio:  Not significant  -UACR 0.07  -CBC and a basic metabolic panel  -no hydronephrosis or renal masses on renal ultrasound  -Return to clinic in 3 months  -Continue low-sodium diet  -Fluid restriction less than 40 ounces a day  -Avoid NSAIDs  -Farxiga  -continue losartan    CKD bone mineral disease  -Check PTH  -Vitamin D level  -at goal      Hypertension  -Blood pressure is at acceptable  -Goal less than 130/80  -Continue losartan/chlorthalidone/metoprolol/amlodipine  -Low-salt diet      Diabetes type 2  -A1c goal is less than 7  -Continue current medications  -Diabetic diet  -Increase activity and exercise as possible  -On losartan  -Farxiga      Orders Placed This Encounter   . BASIC METABOLIC PANEL   . CBC/DIFF   . HGA1C (HEMOGLOBIN A1C WITH EST AVG GLUCOSE)   . MICROALBUMIN/CREATININE RATIO, URINE, RANDOM   . PARATHYROID HORMONE (PTH)   . PROTEIN/CREATININE RATIO, URINE, RANDOM   . VITAMIN D 25 TOTAL   . URIC ACID   . MAGNESIUM   . PHOSPHORUS   . dapagliflozin propanediol (FARXIGA) 10 mg Oral Tablet

## 2022-01-10 ENCOUNTER — Encounter (HOSPITAL_COMMUNITY): Payer: Self-pay | Admitting: Family

## 2022-01-10 ENCOUNTER — Other Ambulatory Visit: Payer: Self-pay

## 2022-01-10 ENCOUNTER — Emergency Department
Admission: EM | Admit: 2022-01-10 | Discharge: 2022-01-10 | Disposition: A | Discharge status: Home / Self Care | Payer: Medicare Other | Attending: Family | Admitting: Family

## 2022-01-10 DIAGNOSIS — T7840XA Allergy, unspecified, initial encounter: Secondary | ICD-10-CM

## 2022-01-10 DIAGNOSIS — R6889 Other general symptoms and signs: Secondary | ICD-10-CM

## 2022-01-10 DIAGNOSIS — H5789 Other specified disorders of eye and adnexa: Secondary | ICD-10-CM | POA: Insufficient documentation

## 2022-01-10 MED ORDER — LORATADINE 10 MG TABLET
10.0000 mg | ORAL_TABLET | ORAL | Status: AC
Start: 2022-01-10 — End: 2022-01-10
  Administered 2022-01-10: 10 mg via ORAL

## 2022-01-10 MED ORDER — LORATADINE 10 MG TABLET
ORAL_TABLET | ORAL | Status: AC
Start: 2022-01-10 — End: 2022-01-10
  Filled 2022-01-10: qty 1

## 2022-01-10 MED ORDER — TOBRAMYCIN 0.3 % EYE DROPS
1.0000 [drp] | OPHTHALMIC | 0 refills | Status: AC
Start: 2022-01-10 — End: 2022-01-17

## 2022-01-10 MED ORDER — TOBRAMYCIN 0.3 % EYE DROPS
OPHTHALMIC | Status: AC
Start: 2022-01-10 — End: 2022-01-10
  Filled 2022-01-10: qty 1

## 2022-01-10 MED ORDER — TOBRAMYCIN 0.3 % EYE DROPS
1.0000 [drp] | OPHTHALMIC | Status: AC
Start: 2022-01-10 — End: 2022-01-10
  Administered 2022-01-10: 1 [drp] via OPHTHALMIC

## 2022-01-10 MED ORDER — TOBRAMYCIN 0.3 % EYE DROPS
1.0000 [drp] | OPHTHALMIC | 0 refills | Status: DC
Start: 2022-01-10 — End: 2022-01-10

## 2022-01-10 NOTE — ED Triage Notes (Signed)
Pt reports watering eyes for approximately 2 weeks. Denies any injury or other problems.

## 2022-01-10 NOTE — Discharge Instructions (Addendum)
May take allergy medication daily such as Claritin  May use allergy eye drops for itching.   Follow-up with eye doctor as discussed  FOLLOW-UP WITH ANY SPECIALIST PROVIDER AS INDICATED AS SOON AS POSSIBLE BUT NO LATER THAN 3 DAYS.  NOTIFY THE PRIMARY CARE PROVIDER THAT YOU WERE IN THE EMERGENCY DEPARTMENT WITHIN 24 HOURS OF DISCHARGE TO FOLLOW-UP ON YOUR RESULTS AND/OR TREATMENTS.  RETURN TO THE EMERGENCY DEPARTMENT IMMEDIATELY IF NEEDED, NO BETTER, WORSE, NEW SYMPTOMS ARISE, OR YOU CANNOT FOLLOW-UP WITH YOUR PRIMARY CARE PROVIDER IN THE PRESCRIBED TIMEFRAME.

## 2022-01-10 NOTE — ED Nurses Note (Signed)
Pt medicated per provider order and d/c from the waiting room. Pt verbalized understanding of d/c instructions. Pt ambulated out of department.

## 2022-01-10 NOTE — ED Provider Notes (Signed)
[facility]  ED Primary Provider Note  History of Present Illness   Chief Complaint   Patient presents with   . Eye Problem     Arrival: The patient arrived by Car    Matthew Sparks is a 66 y.o. male who had concerns including Eye Problem. PT presents to the ER for watering eyes x 2 weeks. He states denies new medications.  He was on Ozempic. He states his eyes are itching.  He denies any other associated s/s such as fever, chills, cough, runny nose, earache.     Review of Systems   All other systems reviewed and are negative except as noted.    Historical Data   History Reviewed This Encounter: Medical History  Surgical History  Family History  Social History      Physical Exam   ED Triage Vitals [01/10/22 1920]   BP (Non-Invasive) (!) 149/66   Heart Rate 78   Respiratory Rate 18   Temperature 36.2 C (97.1 F)   SpO2 98 %   Weight 92.1 kg (203 lb)   Height 1.778 m ('5\' 10"'$ )       Constitutional:  66 y.o. male who appears in no distress. Normal color, no cyanosis.   HENT:   Head: Normocephalic and atraumatic.   Mouth/Throat: Oropharynx is clear and moist.   Eyes: EOMI, PERRL. Mild redness noted to right eye.   Ears: Dull TM bilateral.   Neck: Trachea midline. Neck supple.  Cardiovascular: RRR, S1, S2. No murmurs, rubs or gallops. Intact distal pulses.  Pulmonary/Chest: BS clear and equal bilaterally. resp even and non-labored. No respiratory distress. No wheezes, rales or chest tenderness.   Abdominal: Bowel sounds present and normal. Abdomen soft, no tenderness, no rebound and no guarding.  Back: No midline spinal tenderness, no paraspinal tenderness, no CVA tenderness.           Musculoskeletal: No edema, tenderness or deformity.  Skin: warm and dry. No rash, erythema, pallor or cyanosis  Psychiatric: normal mood and affect. Behavior is normal.   Neurological: Patient keenly alert and responsive, easily able to raise eyebrows, facial muscles/expressions symmetric, speaking in  fluent sentences, moving all extremities equally and fully, normal gait  Patient Data   Labs Ordered/Reviewed - No data to display  No orders to display     Medical Decision Making        Medical Decision Making  Will discharge patient home due to no significant/urgent findings requiring admission.  Patient was instructed to take medication as ordered including Claritin daily, use allergy eyedrops over-the-counter, and antibiotic drops.  Patient instructed to follow up with his eye doctor for recheck or with his primary care provider for recheck.  He was instructed to return to the ER for any problems or worsening.  Eye exam shows some mild redness to the right eye with some watering noted.  Patient has no other significant findings, no loss of vision, and no swelling to area. Pt verbalized understanding.              Medications Administered in the ED   Tobramycin Sulfate (TOBREX) 0.3 % ophthalmic solution 1 Drop (has no administration in time range)   loratadine (CLARITIN) tablet (has no administration in time range)     Clinical Impression   Itchy eyes (Primary)   Allergies       Disposition: Discharged    .  Current Discharge Medication List      START taking these  medications    Details   Tobramycin Sulfate (TOBREX) 0.3 % Ophthalmic Drops Instill 1-2 Drops into both eyes Every 4 hours for 7 days  Qty: 1 Each, Refills: 0

## 2022-01-24 ENCOUNTER — Emergency Department (EMERGENCY_DEPARTMENT_HOSPITAL): Payer: Medicare Other

## 2022-01-24 ENCOUNTER — Other Ambulatory Visit: Payer: Self-pay

## 2022-01-24 ENCOUNTER — Emergency Department
Admission: EM | Admit: 2022-01-24 | Discharge: 2022-01-24 | Disposition: A | Discharge status: Home / Self Care | Payer: Medicare Other | Attending: Emergency Medicine | Admitting: Emergency Medicine

## 2022-01-24 ENCOUNTER — Emergency Department (HOSPITAL_COMMUNITY): Payer: Medicare Other

## 2022-01-24 ENCOUNTER — Encounter (HOSPITAL_COMMUNITY): Payer: Self-pay

## 2022-01-24 DIAGNOSIS — I129 Hypertensive chronic kidney disease with stage 1 through stage 4 chronic kidney disease, or unspecified chronic kidney disease: Secondary | ICD-10-CM | POA: Insufficient documentation

## 2022-01-24 DIAGNOSIS — R531 Weakness: Secondary | ICD-10-CM

## 2022-01-24 DIAGNOSIS — G319 Degenerative disease of nervous system, unspecified: Secondary | ICD-10-CM | POA: Insufficient documentation

## 2022-01-24 DIAGNOSIS — Z8673 Personal history of transient ischemic attack (TIA), and cerebral infarction without residual deficits: Secondary | ICD-10-CM

## 2022-01-24 DIAGNOSIS — E1122 Type 2 diabetes mellitus with diabetic chronic kidney disease: Secondary | ICD-10-CM | POA: Insufficient documentation

## 2022-01-24 DIAGNOSIS — I672 Cerebral atherosclerosis: Secondary | ICD-10-CM | POA: Insufficient documentation

## 2022-01-24 DIAGNOSIS — E785 Hyperlipidemia, unspecified: Secondary | ICD-10-CM

## 2022-01-24 DIAGNOSIS — N189 Chronic kidney disease, unspecified: Secondary | ICD-10-CM | POA: Insufficient documentation

## 2022-01-24 DIAGNOSIS — Z87828 Personal history of other (healed) physical injury and trauma: Secondary | ICD-10-CM

## 2022-01-24 DIAGNOSIS — R519 Headache, unspecified: Secondary | ICD-10-CM

## 2022-01-24 DIAGNOSIS — Z8603 Personal history of neoplasm of uncertain behavior: Secondary | ICD-10-CM

## 2022-01-24 LAB — CBC WITH DIFF
BASOPHIL #: 0 10*3/uL (ref 0.00–0.30)
BASOPHIL %: 1 % (ref 0–3)
EOSINOPHIL #: 0.1 10*3/uL (ref 0.00–0.80)
EOSINOPHIL %: 2 % (ref 0–7)
HCT: 38.3 % — ABNORMAL LOW (ref 42.0–51.0)
HGB: 12.4 g/dL — ABNORMAL LOW (ref 13.5–18.0)
LYMPHOCYTE #: 1.7 10*3/uL (ref 1.10–5.00)
LYMPHOCYTE %: 26 % (ref 25–45)
MCH: 29.2 pg (ref 27.0–32.0)
MCHC: 32.3 g/dL (ref 32.0–36.0)
MCV: 90.4 fL (ref 78.0–99.0)
MONOCYTE #: 0.6 10*3/uL (ref 0.00–1.30)
MONOCYTE %: 10 % (ref 0–12)
MPV: 10.2 fL (ref 7.4–10.4)
NEUTROPHIL #: 4.1 10*3/uL (ref 1.80–8.40)
NEUTROPHIL %: 62 % (ref 40–76)
PLATELETS: 166 10*3/uL (ref 140–440)
RBC: 4.24 10*6/uL (ref 4.20–6.00)
RDW: 13.7 % (ref 11.6–14.8)
WBC: 6.6 10*3/uL (ref 4.0–10.5)
WBCS UNCORRECTED: 6.6 10*3/uL

## 2022-01-24 LAB — COMPREHENSIVE METABOLIC PANEL, NON-FASTING
ALBUMIN/GLOBULIN RATIO: 1.4 (ref 0.8–1.4)
ALBUMIN: 4.3 g/dL (ref 3.5–5.7)
ALKALINE PHOSPHATASE: 56 U/L (ref 34–104)
ALT (SGPT): 62 U/L — ABNORMAL HIGH (ref 7–52)
ANION GAP: 7 mmol/L — ABNORMAL LOW (ref 10–20)
AST (SGOT): 40 U/L — ABNORMAL HIGH (ref 13–39)
BILIRUBIN TOTAL: 0.7 mg/dL (ref 0.3–1.2)
BUN/CREA RATIO: 17 (ref 6–22)
BUN: 30 mg/dL — ABNORMAL HIGH (ref 7–25)
CALCIUM, CORRECTED: 9.5 mg/dL (ref 8.9–10.8)
CALCIUM: 9.8 mg/dL (ref 8.6–10.3)
CHLORIDE: 107 mmol/L (ref 98–107)
CO2 TOTAL: 25 mmol/L (ref 21–31)
CREATININE: 1.76 mg/dL — ABNORMAL HIGH (ref 0.60–1.30)
ESTIMATED GFR: 42 mL/min/{1.73_m2} — ABNORMAL LOW (ref >59)
GLOBULIN: 3 (ref 2.9–5.4)
GLUCOSE: 124 mg/dL — ABNORMAL HIGH (ref 74–109)
OSMOLALITY, CALCULATED: 285 mOsm/kg (ref 270–290)
POTASSIUM: 3.9 mmol/L (ref 3.5–5.1)
PROTEIN TOTAL: 7.3 g/dL (ref 6.4–8.9)
SODIUM: 139 mmol/L (ref 136–145)

## 2022-01-24 MED ORDER — LORATADINE 10 MG TABLET
10.0000 mg | ORAL_TABLET | Freq: Once | ORAL | Status: AC
Start: 2022-01-24 — End: 2022-01-24
  Administered 2022-01-24: 10 mg via ORAL

## 2022-01-24 MED ORDER — LORATADINE 10 MG TABLET
ORAL_TABLET | ORAL | Status: AC
Start: 2022-01-24 — End: 2022-01-24
  Filled 2022-01-24: qty 1

## 2022-01-24 MED ORDER — KETOROLAC 30 MG/ML (1 ML) INJECTION SOLUTION
INTRAMUSCULAR | Status: AC
Start: 2022-01-24 — End: 2022-01-24
  Filled 2022-01-24: qty 1

## 2022-01-24 MED ORDER — KETOROLAC 30 MG/ML (1 ML) INJECTION SOLUTION
30.0000 mg | Freq: Once | INTRAMUSCULAR | Status: AC
Start: 2022-01-24 — End: 2022-01-24
  Administered 2022-01-24: 30 mg via INTRAVENOUS

## 2022-01-24 NOTE — ED Triage Notes (Signed)
Left sided head pain that made worse with cough or sudden movement, radiates down left side of neck x1 week. 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

## 2022-01-24 NOTE — Discharge Instructions (Signed)
Advise relocate and establish with a neurosurgeon to deal with your spinal tumor issues in the near future.  If your symptoms recur or change in presentation please return to the emergency department for re-evaluation

## 2022-01-24 NOTE — ED Nurses Note (Signed)
Patient discharged home with family.  AVS reviewed with patient/care giver.  A written copy of the AVS and discharge instructions was given to the patient/care giver.  Questions sufficiently answered as needed.  Patient/care giver encouraged to follow up with PCP as indicated.  In the event of an emergency, patient/care giver instructed to call 911 or go to the nearest emergency room.

## 2022-01-25 DIAGNOSIS — R519 Headache, unspecified: Secondary | ICD-10-CM

## 2022-01-26 ENCOUNTER — Encounter (HOSPITAL_COMMUNITY): Payer: Self-pay | Admitting: Emergency Medicine
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
## 2022-03-27 ENCOUNTER — Other Ambulatory Visit: Payer: Medicare Other | Attending: Nephrology | Admitting: Nephrology

## 2022-03-27 ENCOUNTER — Ambulatory Visit (INDEPENDENT_AMBULATORY_CARE_PROVIDER_SITE_OTHER): Payer: Medicare Other

## 2022-03-27 ENCOUNTER — Other Ambulatory Visit: Payer: Self-pay

## 2022-03-27 DIAGNOSIS — E1121 Type 2 diabetes mellitus with diabetic nephropathy: Secondary | ICD-10-CM | POA: Insufficient documentation

## 2022-03-27 DIAGNOSIS — I10 Essential (primary) hypertension: Secondary | ICD-10-CM

## 2022-03-27 DIAGNOSIS — I129 Hypertensive chronic kidney disease with stage 1 through stage 4 chronic kidney disease, or unspecified chronic kidney disease: Secondary | ICD-10-CM

## 2022-03-27 DIAGNOSIS — N1832 Chronic kidney disease, stage 3b (CMS HCC): Secondary | ICD-10-CM

## 2022-03-27 LAB — BASIC METABOLIC PANEL
ANION GAP: 10 mmol/L (ref 4–13)
BUN/CREA RATIO: 19 (ref 6–22)
BUN: 32 mg/dL — ABNORMAL HIGH (ref 7–25)
CALCIUM: 10.1 mg/dL (ref 8.6–10.3)
CHLORIDE: 107 mmol/L (ref 98–107)
CO2 TOTAL: 23 mmol/L (ref 21–31)
CREATININE: 1.72 mg/dL — ABNORMAL HIGH (ref 0.60–1.30)
ESTIMATED GFR: 43 mL/min/{1.73_m2} — ABNORMAL LOW (ref >59)
GLUCOSE: 100 mg/dL (ref 74–109)
OSMOLALITY, CALCULATED: 287 mOsm/kg (ref 270–290)
POTASSIUM: 3.9 mmol/L (ref 3.5–5.1)
SODIUM: 140 mmol/L (ref 136–145)

## 2022-03-27 LAB — MICROALBUMIN/CREATININE RATIO, URINE, RANDOM
CREATININE RANDOM URINE: 147 mg/dL — ABNORMAL HIGH (ref 11–26)
MICROALBUMIN RANDOM URINE: 23.9 mg/dL
MICROALBUMIN/CREATININE RATIO RANDOM URINE: 162.6 mg/g

## 2022-03-27 LAB — CBC WITH DIFF
BASOPHIL #: 0 10*3/uL (ref 0.00–0.10)
BASOPHIL %: 1 % (ref 0–1)
EOSINOPHIL #: 0.2 10*3/uL (ref 0.00–0.50)
EOSINOPHIL %: 2 %
HCT: 40.4 % (ref 36.7–47.1)
HGB: 13.1 g/dL (ref 12.5–16.3)
LYMPHOCYTE #: 2.2 10*3/uL (ref 1.00–3.00)
LYMPHOCYTE %: 30 % (ref 16–44)
MCH: 28.7 pg (ref 23.8–33.4)
MCHC: 32.5 g/dL (ref 32.5–36.3)
MCV: 88.3 fL (ref 73.0–96.2)
MONOCYTE #: 0.6 10*3/uL (ref 0.30–1.00)
MONOCYTE %: 9 % (ref 5–13)
MPV: 11.3 fL (ref 7.4–11.4)
NEUTROPHIL #: 4.5 10*3/uL (ref 1.85–7.80)
NEUTROPHIL %: 59 % (ref 43–77)
PLATELETS: 190 10*3/uL (ref 140–440)
RBC: 4.57 10*6/uL (ref 4.06–5.63)
RDW: 13.4 % (ref 12.1–16.2)
WBC: 7.6 10*3/uL (ref 3.6–10.2)

## 2022-03-27 LAB — URIC ACID: URIC ACID: 9.4 mg/dL — ABNORMAL HIGH (ref 2.3–7.6)

## 2022-03-27 LAB — PHOSPHORUS: PHOSPHORUS: 3.8 mg/dL (ref 3.7–7.2)

## 2022-03-27 LAB — PARATHYROID HORMONE (PTH): PTH: 42.4 pg/mL (ref 12.0–88.0)

## 2022-03-27 LAB — PROTEIN/CREATININE RATIO, URINE, RANDOM
CREATININE RANDOM URINE: 147 mg/dL — ABNORMAL HIGH (ref 11–26)
PROTEIN RANDOM URINE: 38 mg/dL — ABNORMAL LOW (ref 50–80)
PROTEIN/CREATININE RATIO RANDOM URINE: 0.259 mg/mg (ref 0.000–200.000)

## 2022-03-27 LAB — VITAMIN D 25 TOTAL: VITAMIN D: 44 ng/mL (ref 30–100)

## 2022-03-27 LAB — MAGNESIUM: MAGNESIUM: 1.8 mg/dL — ABNORMAL LOW (ref 1.9–2.7)

## 2022-03-28 LAB — HGA1C (HEMOGLOBIN A1C WITH EST AVG GLUCOSE): HEMOGLOBIN A1C: 7.5 % — ABNORMAL HIGH (ref 4.0–6.0)

## 2022-04-03 ENCOUNTER — Encounter (INDEPENDENT_AMBULATORY_CARE_PROVIDER_SITE_OTHER): Payer: Self-pay | Admitting: Nephrology

## 2022-04-03 ENCOUNTER — Other Ambulatory Visit: Payer: Self-pay

## 2022-04-03 ENCOUNTER — Ambulatory Visit (INDEPENDENT_AMBULATORY_CARE_PROVIDER_SITE_OTHER): Payer: Medicare Other | Admitting: Nephrology

## 2022-04-03 VITALS — BP 131/66 | HR 60 | Ht 70.0 in | Wt 217.0 lb

## 2022-04-03 DIAGNOSIS — I129 Hypertensive chronic kidney disease with stage 1 through stage 4 chronic kidney disease, or unspecified chronic kidney disease: Secondary | ICD-10-CM

## 2022-04-03 DIAGNOSIS — D631 Anemia in chronic kidney disease: Secondary | ICD-10-CM

## 2022-04-03 DIAGNOSIS — N1831 Chronic kidney disease, stage 3a (CMS HCC): Secondary | ICD-10-CM | POA: Insufficient documentation

## 2022-04-03 DIAGNOSIS — E119 Type 2 diabetes mellitus without complications: Secondary | ICD-10-CM

## 2022-04-03 DIAGNOSIS — E1122 Type 2 diabetes mellitus with diabetic chronic kidney disease: Secondary | ICD-10-CM

## 2022-04-03 DIAGNOSIS — N1832 Chronic kidney disease, stage 3b (CMS HCC): Secondary | ICD-10-CM | POA: Insufficient documentation

## 2022-04-03 DIAGNOSIS — E790 Hyperuricemia without signs of inflammatory arthritis and tophaceous disease: Secondary | ICD-10-CM | POA: Insufficient documentation

## 2022-04-03 DIAGNOSIS — I10 Essential (primary) hypertension: Secondary | ICD-10-CM

## 2022-04-03 DIAGNOSIS — E559 Vitamin D deficiency, unspecified: Secondary | ICD-10-CM | POA: Insufficient documentation

## 2022-04-03 MED ORDER — ALLOPURINOL 100 MG TABLET
100.0000 mg | ORAL_TABLET | Freq: Every day | ORAL | 3 refills | Status: DC
Start: 2022-04-03 — End: 2022-07-14

## 2022-04-03 MED ORDER — MAGNESIUM OXIDE 400 MG (241.3 MG MAGNESIUM) TABLET
400.0000 mg | ORAL_TABLET | Freq: Every day | ORAL | 3 refills | Status: DC
Start: 2022-04-03 — End: 2022-07-14

## 2022-04-03 NOTE — Addendum Note (Signed)
Addended by: Rogue Jury D on: 04/03/2022 02:25 PM     Modules accepted: Orders
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
## 2022-04-16 ENCOUNTER — Encounter (INDEPENDENT_AMBULATORY_CARE_PROVIDER_SITE_OTHER): Payer: Self-pay | Admitting: Surgery

## 2022-04-17 ENCOUNTER — Telehealth (INDEPENDENT_AMBULATORY_CARE_PROVIDER_SITE_OTHER): Payer: Self-pay | Admitting: Internal Medicine

## 2022-04-30 ENCOUNTER — Encounter (INDEPENDENT_AMBULATORY_CARE_PROVIDER_SITE_OTHER): Payer: Medicare Other | Admitting: Surgery

## 2022-05-14 ENCOUNTER — Other Ambulatory Visit: Payer: Self-pay

## 2022-05-14 ENCOUNTER — Ambulatory Visit (INDEPENDENT_AMBULATORY_CARE_PROVIDER_SITE_OTHER): Payer: Medicare Other | Admitting: Internal Medicine

## 2022-05-14 ENCOUNTER — Encounter (INDEPENDENT_AMBULATORY_CARE_PROVIDER_SITE_OTHER): Payer: Self-pay | Admitting: Internal Medicine

## 2022-05-14 VITALS — BP 142/75 | HR 62 | Temp 97.6°F | Ht 70.0 in | Wt 225.6 lb

## 2022-05-14 DIAGNOSIS — E1122 Type 2 diabetes mellitus with diabetic chronic kidney disease: Secondary | ICD-10-CM

## 2022-05-14 DIAGNOSIS — G8921 Chronic pain due to trauma: Secondary | ICD-10-CM

## 2022-05-14 DIAGNOSIS — E790 Hyperuricemia without signs of inflammatory arthritis and tophaceous disease: Secondary | ICD-10-CM

## 2022-05-14 DIAGNOSIS — I779 Disorder of arteries and arterioles, unspecified: Secondary | ICD-10-CM

## 2022-05-14 DIAGNOSIS — G959 Disease of spinal cord, unspecified: Secondary | ICD-10-CM | POA: Insufficient documentation

## 2022-05-14 DIAGNOSIS — N1832 Chronic kidney disease, stage 3b (CMS HCC): Secondary | ICD-10-CM

## 2022-05-14 DIAGNOSIS — Z1211 Encounter for screening for malignant neoplasm of colon: Secondary | ICD-10-CM

## 2022-05-14 DIAGNOSIS — I129 Hypertensive chronic kidney disease with stage 1 through stage 4 chronic kidney disease, or unspecified chronic kidney disease: Secondary | ICD-10-CM

## 2022-05-14 DIAGNOSIS — E559 Vitamin D deficiency, unspecified: Secondary | ICD-10-CM

## 2022-05-14 DIAGNOSIS — I10 Essential (primary) hypertension: Secondary | ICD-10-CM

## 2022-05-14 HISTORY — DX: Disease of spinal cord, unspecified: G95.9

## 2022-05-14 MED ORDER — ACETAMINOPHEN 500 MG TABLET
650.0000 mg | ORAL_TABLET | Freq: Once | ORAL | Status: DC
Start: 2022-05-14 — End: 2022-05-14
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
## 2022-05-14 NOTE — Nursing Note (Signed)
05/14/22 0936   Recent Weight Change   Have you had a recent unexplained weight loss or gain? N   Health Education and Literacy   How often do you have a problem understanding what is told to you about your medical condition?  Never   Domestic Violence   Because we are aware of abuse and domestic violence today, we ask all patients: Are you being hurt, hit, or frightened by anyone at your home or in your life?  N   Basic Needs   Do you have any basic needs within your home that are not being met? (such as Food, Shelter, Games developer, Tranportation, paying for bills and/or medications) N   Advanced Directives   Do you have any advanced directives? Living Will & MPOA   Do you have the Advanced Directive(s) so we can scan them to your chart? Matthew Sparks

## 2022-05-14 NOTE — Nursing Note (Signed)
05/14/22 0936   Fall Risk Assessment   Do you feel unsteady when standing or walking? No   Do you worry about falling? No   Have you fallen in the past year? No

## 2022-05-14 NOTE — Nursing Note (Signed)
05/14/22 0936   Depression Screen   Little interest or pleasure in doing things. 0   Feeling down, depressed, or hopeless 0   PHQ 2 Total 0

## 2022-05-22 ENCOUNTER — Other Ambulatory Visit (INDEPENDENT_AMBULATORY_CARE_PROVIDER_SITE_OTHER): Payer: Self-pay | Admitting: Internal Medicine

## 2022-05-22 MED ORDER — CLOPIDOGREL 75 MG TABLET
75.0000 mg | ORAL_TABLET | Freq: Every day | ORAL | 1 refills | Status: DC
Start: 2022-05-22 — End: 2022-11-20

## 2022-06-17 ENCOUNTER — Other Ambulatory Visit (INDEPENDENT_AMBULATORY_CARE_PROVIDER_SITE_OTHER): Payer: Self-pay | Admitting: Internal Medicine

## 2022-06-17 MED ORDER — POTASSIUM CHLORIDE ER 10 MEQ TABLET,EXTENDED RELEASE(PART/CRYST): 10.0000 meq | ORAL_TABLET | Freq: Every day | ORAL | 1 refills | Status: DC

## 2022-06-17 MED ORDER — ATORVASTATIN 80 MG TABLET
80.0000 mg | ORAL_TABLET | Freq: Every evening | ORAL | 1 refills | Status: DC
Start: 2022-06-17 — End: 2022-07-14

## 2022-07-14 ENCOUNTER — Ambulatory Visit (INDEPENDENT_AMBULATORY_CARE_PROVIDER_SITE_OTHER): Payer: Medicare (Managed Care) | Admitting: Internal Medicine

## 2022-07-14 ENCOUNTER — Encounter (INDEPENDENT_AMBULATORY_CARE_PROVIDER_SITE_OTHER): Payer: Self-pay

## 2022-07-14 ENCOUNTER — Other Ambulatory Visit: Payer: Self-pay

## 2022-07-14 ENCOUNTER — Encounter (INDEPENDENT_AMBULATORY_CARE_PROVIDER_SITE_OTHER): Payer: Self-pay | Admitting: Internal Medicine

## 2022-07-14 VITALS — BP 134/69 | HR 64 | Temp 98.1°F | Resp 18 | Ht 70.0 in | Wt 225.0 lb

## 2022-07-14 DIAGNOSIS — E1122 Type 2 diabetes mellitus with diabetic chronic kidney disease: Secondary | ICD-10-CM

## 2022-07-14 DIAGNOSIS — I779 Disorder of arteries and arterioles, unspecified: Secondary | ICD-10-CM

## 2022-07-14 DIAGNOSIS — F39 Unspecified mood [affective] disorder: Secondary | ICD-10-CM

## 2022-07-14 DIAGNOSIS — N1832 Chronic kidney disease, stage 3b (CMS HCC): Secondary | ICD-10-CM

## 2022-07-14 DIAGNOSIS — I10 Essential (primary) hypertension: Secondary | ICD-10-CM

## 2022-07-14 HISTORY — DX: Disorder of arteries and arterioles, unspecified: I77.9

## 2022-07-14 MED ORDER — HYDRALAZINE 25 MG TABLET
25.0000 mg | ORAL_TABLET | Freq: Every evening | ORAL | 0 refills | Status: DC
Start: 2022-07-14 — End: 2022-10-16

## 2022-07-14 MED ORDER — AMLODIPINE 10 MG TABLET
10.0000 mg | ORAL_TABLET | Freq: Every day | ORAL | 0 refills | Status: AC
Start: 2022-07-14 — End: ?

## 2022-07-14 MED ORDER — ACARBOSE 50 MG TABLET
50.0000 mg | ORAL_TABLET | Freq: Two times a day (BID) | ORAL | Status: DC
Start: 2022-07-14 — End: 2022-07-14

## 2022-07-14 MED ORDER — ATORVASTATIN 80 MG TABLET
80.0000 mg | ORAL_TABLET | Freq: Every evening | ORAL | 1 refills | Status: AC
Start: 2022-07-14 — End: ?

## 2022-07-14 MED ORDER — METOPROLOL SUCCINATE ER 100 MG TABLET,EXTENDED RELEASE 24 HR
100.0000 mg | ORAL_TABLET | Freq: Every day | ORAL | 1 refills | Status: AC
Start: 2022-07-14 — End: ?
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
## 2022-07-25 ENCOUNTER — Other Ambulatory Visit (INDEPENDENT_AMBULATORY_CARE_PROVIDER_SITE_OTHER): Payer: Self-pay | Admitting: Internal Medicine

## 2022-07-25 MED ORDER — ERGOCALCIFEROL (VITAMIN D2) 1,250 MCG (50,000 UNIT) CAPSULE
50000.0000 [IU] | ORAL_CAPSULE | ORAL | 0 refills | Status: AC
Start: 2022-07-25 — End: ?

## 2022-07-29 ENCOUNTER — Ambulatory Visit (HOSPITAL_COMMUNITY): Payer: Self-pay

## 2022-08-11 ENCOUNTER — Ambulatory Visit (INDEPENDENT_AMBULATORY_CARE_PROVIDER_SITE_OTHER): Payer: Self-pay | Admitting: Internal Medicine

## 2022-08-12 ENCOUNTER — Ambulatory Visit (HOSPITAL_COMMUNITY): Payer: Self-pay

## 2022-09-09 ENCOUNTER — Encounter (HOSPITAL_COMMUNITY): Payer: Self-pay

## 2022-09-09 ENCOUNTER — Other Ambulatory Visit: Payer: Self-pay

## 2022-09-09 ENCOUNTER — Emergency Department
Admission: EM | Admit: 2022-09-09 | Discharge: 2022-09-09 | Disposition: A | Discharge status: Home / Self Care | Payer: Medicare (Managed Care) | Attending: Emergency Medicine | Admitting: Emergency Medicine

## 2022-09-09 DIAGNOSIS — N1832 Chronic kidney disease, stage 3b: Secondary | ICD-10-CM | POA: Insufficient documentation

## 2022-09-09 DIAGNOSIS — Z801 Family history of malignant neoplasm of trachea, bronchus and lung: Secondary | ICD-10-CM

## 2022-09-09 DIAGNOSIS — E119 Type 2 diabetes mellitus without complications: Secondary | ICD-10-CM

## 2022-09-09 DIAGNOSIS — M9900 Segmental and somatic dysfunction of head region: Secondary | ICD-10-CM | POA: Insufficient documentation

## 2022-09-09 DIAGNOSIS — F17210 Nicotine dependence, cigarettes, uncomplicated: Secondary | ICD-10-CM

## 2022-09-09 DIAGNOSIS — Z7984 Long term (current) use of oral hypoglycemic drugs: Secondary | ICD-10-CM

## 2022-09-09 DIAGNOSIS — E1122 Type 2 diabetes mellitus with diabetic chronic kidney disease: Secondary | ICD-10-CM | POA: Insufficient documentation

## 2022-09-09 DIAGNOSIS — R519 Headache, unspecified: Secondary | ICD-10-CM | POA: Insufficient documentation

## 2022-09-09 DIAGNOSIS — I129 Hypertensive chronic kidney disease with stage 1 through stage 4 chronic kidney disease, or unspecified chronic kidney disease: Secondary | ICD-10-CM

## 2022-09-09 DIAGNOSIS — I10 Essential (primary) hypertension: Secondary | ICD-10-CM

## 2022-09-09 LAB — GRAY TOP TUBE

## 2022-09-09 LAB — COMPREHENSIVE METABOLIC PANEL, NON-FASTING
ALBUMIN/GLOBULIN RATIO: 1.4 (ref 0.8–1.4)
ALBUMIN: 4.2 g/dL (ref 3.5–5.7)
ALKALINE PHOSPHATASE: 59 U/L (ref 34–104)
ALT (SGPT): 59 U/L — ABNORMAL HIGH (ref 7–52)
ANION GAP: 10 mmol/L (ref 4–13)
AST (SGOT): 45 U/L — ABNORMAL HIGH (ref 13–39)
BILIRUBIN TOTAL: 0.8 mg/dL (ref 0.3–1.2)
BUN/CREA RATIO: 20 (ref 6–22)
BUN: 36 mg/dL — ABNORMAL HIGH (ref 7–25)
CALCIUM, CORRECTED: 9.2 mg/dL (ref 8.9–10.8)
CALCIUM: 9.4 mg/dL (ref 8.6–10.3)
CHLORIDE: 102 mmol/L (ref 98–107)
CO2 TOTAL: 26 mmol/L (ref 21–31)
CREATININE: 1.82 mg/dL — ABNORMAL HIGH (ref 0.60–1.30)
ESTIMATED GFR: 40 mL/min/{1.73_m2} — ABNORMAL LOW (ref >59)
GLOBULIN: 3 (ref 2.9–5.4)
GLUCOSE: 136 mg/dL — ABNORMAL HIGH (ref 74–109)
OSMOLALITY, CALCULATED: 286 mOsm/kg (ref 270–290)
POTASSIUM: 3.8 mmol/L (ref 3.5–5.1)
PROTEIN TOTAL: 7.2 g/dL (ref 6.4–8.9)
SODIUM: 138 mmol/L (ref 136–145)

## 2022-09-09 LAB — GOLD TOP TUBE

## 2022-09-09 LAB — LAVENDER TOP TUBE

## 2022-09-09 LAB — BLUE TOP TUBE

## 2022-09-09 MED ORDER — OXYCODONE-ACETAMINOPHEN 10 MG-325 MG TABLET
1.0000 | ORAL_TABLET | Freq: Four times a day (QID) | ORAL | 0 refills | Status: AC | PRN
Start: 2022-09-09 — End: 2022-09-21
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
## 2022-09-09 NOTE — ED Triage Notes (Signed)
States HX of 6 strokes. States he has had a left side headache since yesterday. Denies any other c/o.   Received letter from PCP of abnormal labs from March 8th.

## 2022-09-09 NOTE — Discharge Instructions (Signed)
Avoid high doses Tylenol/acetaminophen.  You should have no more than 2 g of acetaminophen in 24 hours   Percocet every 6 hours needed for severe pain.  This is a short term fixed and should not be used long-term  Avoid ibuprofen and leave   Continue all your other medications at home as prescribed by your doctor   If the headache continues, you should be seen by neurologist   If you break out into a rash in this area return to the ER or call your doctor.

## 2022-09-09 NOTE — ED Nurses Note (Signed)
Patient discharged home with family.  AVS reviewed with patient/care giver.  A written copy of the AVS and discharge instructions was given to the patient/care giver.  Questions sufficiently answered as needed.  Patient/care giver encouraged to follow up with PCP as indicated.  In the event of an emergency, patient/care giver instructed to call 911 or go to the nearest emergency room.

## 2022-10-07 ENCOUNTER — Emergency Department (HOSPITAL_COMMUNITY): Payer: Medicare (Managed Care)

## 2022-10-07 ENCOUNTER — Encounter (HOSPITAL_COMMUNITY): Payer: Self-pay | Admitting: Family

## 2022-10-07 ENCOUNTER — Emergency Department
Admission: EM | Admit: 2022-10-07 | Discharge: 2022-10-07 | Disposition: A | Discharge status: Home / Self Care | Payer: Medicare (Managed Care) | Attending: Family | Admitting: Family

## 2022-10-07 ENCOUNTER — Other Ambulatory Visit: Payer: Self-pay

## 2022-10-07 DIAGNOSIS — M19031 Primary osteoarthritis, right wrist: Secondary | ICD-10-CM

## 2022-10-07 DIAGNOSIS — R519 Headache, unspecified: Secondary | ICD-10-CM

## 2022-10-07 DIAGNOSIS — E119 Type 2 diabetes mellitus without complications: Secondary | ICD-10-CM

## 2022-10-07 DIAGNOSIS — M25531 Pain in right wrist: Secondary | ICD-10-CM

## 2022-10-07 DIAGNOSIS — N183 Chronic kidney disease, stage 3 unspecified (CMS HCC): Secondary | ICD-10-CM

## 2022-10-07 DIAGNOSIS — E1122 Type 2 diabetes mellitus with diabetic chronic kidney disease: Secondary | ICD-10-CM

## 2022-10-07 MED ORDER — BUTALBITAL-ACETAMINOPHEN-CAFFEINE 50 MG-300 MG-40 MG CAPSULE
1.0000 | ORAL_CAPSULE | Freq: Four times a day (QID) | ORAL | 0 refills | Status: AC | PRN
Start: 2022-10-07 — End: 2022-10-10

## 2022-10-07 MED ORDER — DEXAMETHASONE SODIUM PHOSPHATE 4 MG/ML INJECTION SOLUTION
4.0000 mg | INTRAMUSCULAR | Status: AC
Start: 2022-10-07 — End: 2022-10-07
  Administered 2022-10-07: 4 mg via INTRAMUSCULAR

## 2022-10-07 MED ORDER — DEXAMETHASONE SODIUM PHOSPHATE 4 MG/ML INJECTION SOLUTION
INTRAMUSCULAR | Status: AC
Start: 2022-10-07 — End: 2022-10-07
  Filled 2022-10-07: qty 1

## 2022-10-07 MED ORDER — DICLOFENAC 1 % TOPICAL GEL
Freq: Four times a day (QID) | CUTANEOUS | 0 refills | Status: AC
Start: 2022-10-07 — End: 2022-10-14

## 2022-10-07 NOTE — ED Nurses Note (Signed)
Pt evaluated and treated by medical provider while in waiting room area. No primary RN assigned; therefore, no assessment performed. All paperwork given and questions answered. Pt acknowledged all understanding. Leaving waiting area via ambulation

## 2022-10-07 NOTE — ED Provider Notes (Signed)
[facility]  ED Primary Provider Note  History of Present Illness   Chief Complaint   Patient presents with    Wrist Pain    Headache     Arrival: The patient arrived by Car  Matthew Sparks is a 67 y.o. male who had concerns including Wrist Pain and Headache. He states his Rt wrist pain started 3 weeks ago suddenly and describes it as sharp pain. The pain increased in intensity last night and was not able to sleep. Pain is worse with wrist movement. He denies any recent trauma or injury. He denies any paresthesia or numbness of his Rt arm and fingers. He also has been experiencing left sided headaches for the past 3 weeks. He states he was here about a month ago with similar symptoms but has since returned. He has tried Tylenol with no relief. He denies any dizziness, blurred vision, N/V/D, or photophobia. He denies any acute changes or worsening of his Lt sided weakness and slurred speech outside his norm from his past history of stroke. He states this headache started on Sunday. He states it is like his typical headaches. He states he did receive pain medication on last visit that helped with his pain. Pt denies chest pain, sob, n/v, swelling. He has appt with PCP on Thursday 10/09/2022    Review of Systems   All other systems reviewed and are negative except as noted.    Historical Data   History Reviewed This Encounter: Medical History  Surgical History  Family History  Social History    Physical Exam   ED Triage Vitals [10/07/22 0731]   BP (Non-Invasive) 139/76   Heart Rate 98   Respiratory Rate 16   Temperature 37 C (98.6 F)   SpO2 99 %   Weight 95.3 kg (210 lb)   Height 1.778 m ( )       Constitutional:  67 y.o. male who appears in no distress. Normal color, no cyanosis.   HENT:   Head: Normocephalic and atraumatic.   Mouth/Throat: Oropharynx is clear and moist.   Eyes: EOMI, PERRL   Neck: Trachea midline. Neck supple.  Cardiovascular: RRR, S1, S2. No murmurs, rubs or  gallops. Intact distal pulses.  Pulmonary/Chest: BS clear and equal bilaterally. Resp even and nonlabored. No respiratory distress. No wheezes, rales or chest tenderness.   Abdominal: Bowel sounds present and normal. Abdomen soft, no tenderness, no rebound and no guarding.  Back: No midline spinal tenderness, no paraspinal tenderness, no CVA tenderness.           Musculoskeletal: No edema or deformity. Noted tenderness to the mid right wrist   Skin: warm and dry. No rash, erythema, pallor or cyanosis  Psychiatric: normal mood and affect. Behavior is normal.   Neurological: Patient keenly alert and responsive, easily able to raise eyebrows, facial muscles/expressions symmetric, speaking in fluent sentences, moving all extremities equally and fully, normal gait.GCS 15  Patient Data   Labs Ordered/Reviewed - No data to display  XR WRIST RIGHT   Final Result by Edi, Radresults In (04/16 0906)   NO ACUTE BONY ABNORMALITY OR INJURY                Radiologist location ID: [id number]           Medical Decision Making        Medical Decision Making  Will discharge patient home due to no significant/urgent findings requiring admission.  Patient has some mild degenerative changes  noted in the right wrist.  He may need to follow with orthopedic for ongoing pain and issues.  Patient complains of headache which is unchanged from his typical headaches.  He notes that he was given Percocet on his last visit which did help with the pain somewhat.  Patient does have an appointment with his primary care provider in 2 days.  We will give him a small amount of medication here, he was cautioned to watch his glucose at home.  Will put him on Fioricet for the headache.  Patient to discuss further pain management with his primary care provider.  It does appear the patient had previously been evaluated for chronic pain management and found not to be a candidate through the [facility] system.     Amount and/or Complexity of Data  Reviewed  Radiology: ordered. Decision-making details documented in ED Course.    Critical Care  Total time providing critical care: 0 minutes      ED Course as of 10/07/22 1007   Tue Oct 07, 2022   0957 Pt medication use is limited, he has a history of chronic kidney disease as well as diabetes.  Patient has been taking Tylenol without relief of pain.  We will give him a small dose dexamethasone for inflammation and pain with both the headache and the wrist.  We will place the patient on some topical medication for the risks and Fioricet for the headache.  He does have an appointment with his primary care provider in 2 days         Medications Administered in the ED   dexAMETHasone 4 mg/mL injection (has no administration in time range)     Clinical Impression   Arthritis of right wrist (Primary)   Right wrist pain   Headache   Chronic kidney disease, stage 3 (CMS HCC)   Type 2 diabetes mellitus (CMS HCC)       Disposition: Discharged    Current Discharge Medication List        START taking these medications    Details   Butalbital-Acetaminophen-Caff 50-300-40 mg Oral Capsule Take 1 Capsule by mouth Every 6 hours as needed for up to 3 days  Qty: 12 Capsule, Refills: 0      diclofenac sodium (VOLTAREN) 1 % Gel Apply topically Four times a day for 7 days  Qty: 2 g, Refills: 0

## 2022-10-07 NOTE — Discharge Instructions (Signed)
FOLLOW UP WITH THE PRIMARY CARE PROVIDER AS SOON AS POSSIBLE BUT NO LATER THAN 3 DAYS NOW.  IF NO PRIMARY CARE PROVIDER EXISTS, THEN THE PATIENT IS INSTRUCTED TO ESTABLISH CARE WITH A PRIMARY CARE PROVIDER. FOLLOW-UP WITH ANY SPECIALIST PROVIDER AS INDICATED AS SOON AS POSSIBLE BUT NO LATER THAN 3 DAYS.  NOTIFY THE PRIMARY CARE PROVIDER THAT YOU WERE IN THE EMERGENCY DEPARTMENT WITHIN 24 HOURS OF DISCHARGE TO FOLLOW-UP ON YOUR RESULTS AND/OR TREATMENTS.  RETURN TO THE EMERGENCY DEPARTMENT IMMEDIATELY IF NEEDED, NO BETTER, WORSE, NEW SYMPTOMS ARISE, OR YOU CANNOT FOLLOW-UP WITH YOUR PRIMARY CARE PROVIDER IN THE PRESCRIBED TIMEFRAME.

## 2022-10-07 NOTE — ED Triage Notes (Signed)
Rt wrist pain x3 weeks, increasing last night. Also c/o headache on the left side. Denies dizziness/blurred vision/N/V/D.

## 2022-10-08 ENCOUNTER — Ambulatory Visit (INDEPENDENT_AMBULATORY_CARE_PROVIDER_SITE_OTHER): Payer: Medicare (Managed Care)

## 2022-10-08 ENCOUNTER — Ambulatory Visit: Payer: Medicare (Managed Care) | Attending: Nephrology | Admitting: Nephrology

## 2022-10-08 ENCOUNTER — Encounter (INDEPENDENT_AMBULATORY_CARE_PROVIDER_SITE_OTHER): Payer: Self-pay | Admitting: Nephrology

## 2022-10-08 DIAGNOSIS — N1832 Chronic kidney disease, stage 3b (CMS HCC): Secondary | ICD-10-CM

## 2022-10-08 DIAGNOSIS — D631 Anemia in chronic kidney disease: Secondary | ICD-10-CM

## 2022-10-08 DIAGNOSIS — E559 Vitamin D deficiency, unspecified: Secondary | ICD-10-CM | POA: Insufficient documentation

## 2022-10-08 DIAGNOSIS — N1831 Chronic kidney disease, stage 3a (CMS HCC): Secondary | ICD-10-CM

## 2022-10-08 LAB — COMPREHENSIVE METABOLIC PANEL, NON-FASTING
ALBUMIN/GLOBULIN RATIO: 1.5 — ABNORMAL HIGH (ref 0.8–1.4)
ALBUMIN: 4.8 g/dL (ref 3.5–5.7)
ALKALINE PHOSPHATASE: 60 U/L (ref 34–104)
ALT (SGPT): 60 U/L — ABNORMAL HIGH (ref 7–52)
ANION GAP: 11 mmol/L (ref 4–13)
AST (SGOT): 40 U/L — ABNORMAL HIGH (ref 13–39)
BILIRUBIN TOTAL: 0.8 mg/dL (ref 0.3–1.2)
BUN/CREA RATIO: 18 (ref 6–22)
BUN: 33 mg/dL — ABNORMAL HIGH (ref 7–25)
CALCIUM, CORRECTED: 9.5 mg/dL (ref 8.9–10.8)
CALCIUM: 10.1 mg/dL (ref 8.6–10.3)
CHLORIDE: 102 mmol/L (ref 98–107)
CO2 TOTAL: 26 mmol/L (ref 21–31)
CREATININE: 1.81 mg/dL — ABNORMAL HIGH (ref 0.60–1.30)
ESTIMATED GFR: 40 mL/min/{1.73_m2} — ABNORMAL LOW (ref >59)
GLOBULIN: 3.3 (ref 2.9–5.4)
GLUCOSE: 111 mg/dL — ABNORMAL HIGH (ref 74–109)
OSMOLALITY, CALCULATED: 286 mOsm/kg (ref 270–290)
POTASSIUM: 3.5 mmol/L (ref 3.5–5.1)
PROTEIN TOTAL: 8.1 g/dL (ref 6.4–8.9)
SODIUM: 139 mmol/L (ref 136–145)

## 2022-10-08 LAB — VITAMIN D 25 TOTAL: VITAMIN D: 52 ng/mL (ref 30–100)

## 2022-10-08 LAB — VITAMIN B12: VITAMIN B 12: 538 pg/mL (ref 180–914)

## 2022-10-08 LAB — CBC
HCT: 40.4 % (ref 36.7–47.1)
HGB: 13.2 g/dL (ref 12.5–16.3)
MCH: 29 pg (ref 23.8–33.4)
MCHC: 32.7 g/dL (ref 32.5–36.3)
MCV: 88.7 fL (ref 73.0–96.2)
MPV: 10.6 fL (ref 7.4–11.4)
PLATELETS: 198 10*3/uL (ref 140–440)
RBC: 4.55 10*6/uL (ref 4.06–5.63)
RDW: 13.9 % (ref 12.1–16.2)
WBC: 14.1 10*3/uL — ABNORMAL HIGH (ref 3.6–10.2)

## 2022-10-08 LAB — IRON TRANSFERRIN AND TIBC
IRON (TRANSFERRIN) SATURATION: 26 % (ref 20–50)
IRON: 112 ug/dL (ref 50–212)
TOTAL IRON BINDING CAPACITY: 424 ug/dL (ref 250–450)
TRANSFERRIN: 303 mg/dL (ref 203–362)
UIBC: 312 ug/dL (ref 130–375)

## 2022-10-08 LAB — PROTEIN/CREATININE RATIO, URINE, RANDOM
CREATININE RANDOM URINE: 182 mg/dL — ABNORMAL HIGH (ref 30–125)
PROTEIN RANDOM URINE: 47 mg/dL — ABNORMAL LOW (ref 50–80)
PROTEIN/CREATININE RATIO RANDOM URINE: 0.258 mg/mg (ref 0.000–200.000)

## 2022-10-08 LAB — PHOSPHORUS: PHOSPHORUS: 3.4 mg/dL — ABNORMAL LOW (ref 3.7–7.2)

## 2022-10-08 LAB — FOLATE: FOLATE: >23.8 ng/mL (ref 5.9–24.4)

## 2022-10-08 LAB — MAGNESIUM: MAGNESIUM: 2 mg/dL (ref 1.9–2.7)

## 2022-10-08 LAB — URIC ACID: URIC ACID: 10.1 mg/dL — ABNORMAL HIGH (ref 2.3–7.6)

## 2022-10-08 LAB — PARATHYROID HORMONE (PTH): PTH: 47.4 pg/mL (ref 12.0–88.0)

## 2022-10-09 ENCOUNTER — Emergency Department (HOSPITAL_COMMUNITY): Payer: Medicare (Managed Care)

## 2022-10-09 ENCOUNTER — Encounter (HOSPITAL_COMMUNITY): Payer: Self-pay | Admitting: Emergency Medicine

## 2022-10-09 ENCOUNTER — Emergency Department
Admission: EM | Admit: 2022-10-09 | Discharge: 2022-10-09 | Disposition: A | Discharge status: Home / Self Care | Payer: Medicare (Managed Care) | Attending: Emergency Medicine | Admitting: Emergency Medicine

## 2022-10-09 ENCOUNTER — Other Ambulatory Visit: Payer: Self-pay

## 2022-10-09 DIAGNOSIS — M25531 Pain in right wrist: Secondary | ICD-10-CM | POA: Insufficient documentation

## 2022-10-09 DIAGNOSIS — M109 Gout, unspecified: Secondary | ICD-10-CM

## 2022-10-09 DIAGNOSIS — M79601 Pain in right arm: Secondary | ICD-10-CM | POA: Insufficient documentation

## 2022-10-09 DIAGNOSIS — M10031 Idiopathic gout, right wrist: Secondary | ICD-10-CM

## 2022-10-09 LAB — CBC WITH DIFF
BASOPHIL #: 0.1 10*3/uL (ref 0.00–0.10)
BASOPHIL %: 1 % (ref 0–1)
EOSINOPHIL #: 0.1 10*3/uL (ref 0.00–0.50)
EOSINOPHIL %: 2 %
HCT: 38.9 % (ref 36.7–47.1)
HGB: 12.9 g/dL (ref 12.5–16.3)
LYMPHOCYTE #: 2.3 10*3/uL (ref 1.00–3.00)
LYMPHOCYTE %: 26 % (ref 16–44)
MCH: 29.4 pg (ref 23.8–33.4)
MCHC: 33.1 g/dL (ref 32.5–36.3)
MCV: 88.7 fL (ref 73.0–96.2)
MONOCYTE #: 0.9 10*3/uL (ref 0.30–1.00)
MONOCYTE %: 11 % (ref 5–13)
MPV: 10.2 fL (ref 7.4–11.4)
NEUTROPHIL #: 5.3 10*3/uL (ref 1.85–7.80)
NEUTROPHIL %: 61 % (ref 43–77)
PLATELETS: 173 10*3/uL (ref 140–440)
RBC: 4.39 10*6/uL (ref 4.06–5.63)
RDW: 13.9 % (ref 12.1–16.2)
WBC: 8.7 10*3/uL (ref 3.6–10.2)

## 2022-10-09 LAB — COMPREHENSIVE METABOLIC PANEL, NON-FASTING
ALBUMIN/GLOBULIN RATIO: 1.5 — ABNORMAL HIGH (ref 0.8–1.4)
ALBUMIN: 4.5 g/dL (ref 3.5–5.7)
ALKALINE PHOSPHATASE: 62 U/L (ref 34–104)
ALT (SGPT): 63 U/L — ABNORMAL HIGH (ref 7–52)
ANION GAP: 6 mmol/L (ref 4–13)
AST (SGOT): 42 U/L — ABNORMAL HIGH (ref 13–39)
BILIRUBIN TOTAL: 0.6 mg/dL (ref 0.3–1.2)
BUN/CREA RATIO: 17 (ref 6–22)
BUN: 34 mg/dL — ABNORMAL HIGH (ref 7–25)
CALCIUM, CORRECTED: 8.9 mg/dL (ref 8.9–10.8)
CALCIUM: 9.3 mg/dL (ref 8.6–10.3)
CHLORIDE: 104 mmol/L (ref 98–107)
CO2 TOTAL: 29 mmol/L (ref 21–31)
CREATININE: 2 mg/dL — ABNORMAL HIGH (ref 0.60–1.30)
ESTIMATED GFR: 36 mL/min/{1.73_m2} — ABNORMAL LOW (ref >59)
GLOBULIN: 3 (ref 2.9–5.4)
GLUCOSE: 126 mg/dL — ABNORMAL HIGH (ref 74–109)
OSMOLALITY, CALCULATED: 287 mOsm/kg (ref 270–290)
POTASSIUM: 3.8 mmol/L (ref 3.5–5.1)
PROTEIN TOTAL: 7.5 g/dL (ref 6.4–8.9)
SODIUM: 139 mmol/L (ref 136–145)

## 2022-10-09 LAB — LACTIC ACID LEVEL W/ REFLEX FOR LEVEL >2.0: LACTIC ACID: 1.3 mmol/L (ref 0.5–2.2)

## 2022-10-09 LAB — PT/INR
INR: 0.98 (ref 0.84–1.10)
PROTHROMBIN TIME: 11.4 seconds (ref 9.8–12.7)

## 2022-10-09 LAB — HGA1C (HEMOGLOBIN A1C WITH EST AVG GLUCOSE): HEMOGLOBIN A1C: 7.6 % — ABNORMAL HIGH (ref 4.0–6.0)

## 2022-10-09 LAB — C-REACTIVE PROTEIN (CRP): C-REACTIVE PROTEIN (CRP): 0.1 mg/dL (ref 0.1–0.5)

## 2022-10-09 LAB — PTT (PARTIAL THROMBOPLASTIN TIME): APTT: 37.8 seconds (ref 25.0–38.0)

## 2022-10-09 LAB — URIC ACID: URIC ACID: 10.4 mg/dL — ABNORMAL HIGH (ref 2.3–7.6)

## 2022-10-09 MED ORDER — HYDROCODONE 5 MG-ACETAMINOPHEN 325 MG TABLET
ORAL_TABLET | ORAL | Status: AC
Start: 2022-10-09 — End: 2022-10-09
  Filled 2022-10-09: qty 1

## 2022-10-09 MED ORDER — PREDNISONE 20 MG TABLET
ORAL_TABLET | ORAL | Status: AC
Start: 2022-10-09 — End: 2022-10-09
  Filled 2022-10-09: qty 1

## 2022-10-09 MED ORDER — HYDROCODONE 5 MG-ACETAMINOPHEN 325 MG TABLET
1.0000 | ORAL_TABLET | ORAL | Status: AC
Start: 2022-10-09 — End: 2022-10-09
  Administered 2022-10-09: 1 via ORAL

## 2022-10-09 MED ORDER — PREDNISONE 20 MG TABLET
20.0000 mg | ORAL_TABLET | Freq: Three times a day (TID) | ORAL | 0 refills | Status: AC
Start: 2022-10-09 — End: 2022-10-14

## 2022-10-09 MED ORDER — HYDROCODONE 5 MG-ACETAMINOPHEN 325 MG TABLET
1.0000 | ORAL_TABLET | Freq: Four times a day (QID) | ORAL | 0 refills | Status: DC | PRN
Start: 2022-10-09 — End: 2023-12-17

## 2022-10-09 MED ORDER — PREDNISONE 20 MG TABLET
20.0000 mg | ORAL_TABLET | ORAL | Status: AC
Start: 2022-10-09 — End: 2022-10-09
  Administered 2022-10-09: 20 mg via ORAL

## 2022-10-09 NOTE — ED Provider Notes (Addendum)
[facility]  ED Primary Provider Note        Arrival: The patient arrived by Car     History of Present Illness   chief complaint  Matthew Sparks is a 67 y.o. male who had concerns including Arm Pain.  Patient was 67 year old male who presents emergency room with a chief complaint of pain to the volar aspect of the left wrist and distal forearm.  Onset approximately 3 weeks ago.  He would become worse 4 days ago.  He was seen in the emergency room for same at that time.  He was had some mild swelling to the wrist and hand.  He was excellent sensation over all dermatomes with significant pain primarily with volar and dorsiflexion.  Is no proximal arm tenderness.  Radial pulses +3.  She was full range of motion without pain of the elbow and shoulder.  He denies any injury.  He denies any fever.  He denies any prior problems with this arm.  All nursing notes reviewed        Review of Systems     No other overt Review of Systems are noted to be positive except noted in the HPI.      Historical Data   History Reviewed This Encounter: Medical History  Surgical History  Family History  Social History      Physical Exam   ED Triage Vitals [10/09/22 1816]   BP (Non-Invasive) (!) 157/69   Heart Rate 74   Respiratory Rate 18   Temperature 36.4 C (97.6 F)   SpO2 99 %   Weight 95.3 kg (210 lb)   Height 1.778 m ( )         Exam:   Constitutional:  Patient alert orient x3 in no apparent distress.  No limitations.  Head: Atraumatic normocephalic  Eyes :  Pupils are equal round reactive to light and accommodation extraocular muscles are intact.  Sclera and conjunctiva are unremarkable  Ears:  Tympanic membranes are pearly gray bilaterally; external auditory canals are unremarkable; external ears without any lesions  Nose:  Nares are patent turbinates are pink and moist  Mouth:  Mucosa is pink and moist without lesions.  Posterior pharynx is pink and moist without hypertrophy/exudate.  Neck:   Soft and supple without palpable lymphadenopathy.  Heart:  Regular rate and rhythm without audible murmur  Lungs:  Clear to auscultation bilaterally without any wheezing/rales/rhonchi  Abdomen:  Soft nontender without any rebound or guarding; positive bowel sounds throughout  Genitalia:  Deferred  Skin:  Warm and dry without lesions.  Normal skin turgor.  Brisk capillary refill distally  Extremities:  Patient was mild swelling to the volar aspect of the distal right forearm, wrists, hands.  It does have painful range of motion.  He was also painful palpation over the volar aspect the wrist and distal forearm.  He was excellent sensation over all dermatomes.  Radial pulse +3.  Brisk capillary refill.  No proximal tenderness or swelling.  Neuro:  Alert oriented x3.  Cranial nerves II-XII grossly intact as tested.  Excellent sensation distally over all dermatomes.  Psychiatric:  Patient cooperative, affect appropriate, insight and judgment good          Procedures      Patient Data     Labs Ordered/Reviewed   COMPREHENSIVE METABOLIC PANEL, NON-FASTING - Abnormal; Notable for the following components:       Result Value    BUN 34 (*)  CREATININE 2.00 (*)     ESTIMATED GFR 36 (*)     GLUCOSE 126 (*)     ALT (SGPT) 63 (*)     AST (SGOT) 42 (*)     ALBUMIN/GLOBULIN RATIO 1.5 (*)     All other components within normal limits    Narrative:     Estimated Glomerular Filtration Rate (eGFR) is calculated using the CKD-EPI (2021) equation, intended for patients 85 years of age and older. If gender is not documented or "unknown", there will be no eGFR calculation.     URIC ACID - Abnormal; Notable for the following components:    URIC ACID 10.4 (*)     All other components within normal limits   PT/INR - Normal    Narrative:     INR OF 2.0-3.0  RECOMMENDED FOR: PROPHYLAXIS/TREATMENT OF VENEOUS THROMBOSIS, PULMONARY EMBOLISM, PREVENTION OF SYSTEMIC EMBOLISM FROM ATRIAL FIBRILATION, MYOCARDIAL INFARCTION.    INR OF  2.5-3.5  RECOMMENDED FOR MECHANICAL PROSTHETIC HEART VALVES, RECURRENT SYSTEMIC EMBOLISM, RECURRENT MYOCARDIAL INFARCTION.     PTT (PARTIAL THROMBOPLASTIN TIME) - Normal   C-REACTIVE PROTEIN (CRP) - Normal   LACTIC ACID LEVEL W/ REFLEX FOR LEVEL >2.0 - Normal   CBC WITH DIFF - Normal   CBC/DIFF    Narrative:     The following orders were created for panel order CBC/DIFF.  Procedure                               Abnormality         Status                     ---------                               -----------         ------                     CBC WITH ZOXW[[id number]]                Normal              Final result                 Please view results for these tests on the individual orders.       No orders to display       Medical Decision Making          Medical Decision Making  Duplex ultrasound was unremarkable.  CBC unremarkable.  Patient was creatinine is 2.0 with a baseline of 1.8.  Glucose slightly elevated 126.  Patient also had mildly elevated ALT and AST.  Lactic is 1.2.  Uric acid ten point 2.  Patient treated with prednisone 20 mg here in emergency room.  Will discharge him home on prednisone 20 mg p.o. t.i.d. until pain is relieved .  Does have positive Tinel sign.  Patient was placed in a volar splint.  Given Lortab 5 here in emergency room.  Will discharge patient home.  With follow up with orthopedic clinic.  Patient was given Fioricet 3 days ago.  I will write him for Lortab 5 12.  One p.o. q.6 hours p.r.n. pain.  See discharge instructions for detailed.    Amount and/or Complexity of Data Reviewed  Labs: ordered. Decision-making details documented in ED Course.  Radiology: ordered and independent interpretation performed. Decision-making details documented in ED Course.    Risk  Prescription drug management.    Critical Care  Total time providing critical care: 0 minutes        ED Course as of 10/09/22 2146   Thu Oct 09, 2022   1844 CBC unremarkable   1907 Patient was creatinine is 2.0 baseline is  1.8 glucose 126 with a mildly elevated ALT and AST.   1907 Lactic is 1.3 with a CRP of 0.1   1907 PT/INR/PTT are all within normal range   2048 Duplex ultrasound of left upper extremity shows no deep vein thrombosis.   2142 Patient was uric acid 10.4.         Medications Administered in the ED   predniSONE (DELTASONE) tablet (has no administration in time range)   HYDROcodone-acetaminophen (NORCO) 5-325 mg per tablet (1 Tablet Oral Given 10/09/22 2053)       Following the history, physical exam, and ED workup, the patient was deemed stable and suitable for discharge. The patient/caregiver was advised to return to the ED for any new or worsening symptoms. Discharge medications, and follow-up instructions were discussed with the patient/caregiver in detail, who verbalizes understanding. The patient/caregiver is in agreement and is comfortable with the plan of care.    Disposition: Discharged         Current Discharge Medication List        START taking these medications.        Details   HYDROcodone-acetaminophen 5-325 mg Tablet  Commonly known as: NORCO   1 Tablet, Oral, EVERY 6 HOURS PRN  Qty: 12 Tablet  Refills: 0     predniSONE 20 mg Tablet  Commonly known as: DELTASONE   20 mg, Oral, 3 TIMES DAILY, Take until relief of pain  Qty: 15 Tablet  Refills: 0            CONTINUE these medications - NO CHANGES were made during your visit.        Details   acetaminophen 500 mg Tablet  Commonly known as: TYLENOL   500 mg, Oral, EVERY 6 HOURS PRN  Refills: 0     * albuterol sulfate 90 mcg/actuation oral inhaler  Commonly known as: PROVENTIL or VENTOLIN or PROAIR   1-2 Puffs, Inhalation, EVERY 6 HOURS PRN  Refills: 0     * albuterol 2.5 mg/0.5 mL Solution for Nebulization  Commonly known as: PROVENTIL   2.5 mg, Nebulization, 3 TIMES DAILY  Refills: 0     amLODIPine 10 mg Tablet  Commonly known as: NORVASC   10 mg, Oral, DAILY  Qty: 90 Tablet  Refills: 0     aspirin 325 mg Tablet   81 mg, Oral, DAILY  Refills: 0      atorvastatin 80 mg Tablet  Commonly known as: LIPITOR   80 mg, Oral, EVERY EVENING  Qty: 90 Tablet  Refills: 1     Butalbital-Acetaminophen-Caff 50-300-40 mg Capsule   1 Capsule, Oral, EVERY 6 HOURS PRN  Qty: 12 Capsule  Refills: 0     chlorTHALIDONE 50 mg Tablet  Commonly known as: HYGROTON   50 mg, Oral, DAILY  Refills: 0     clopidogreL 75 mg Tablet  Commonly known as: PLAVIX   75 mg, Oral, DAILY  Qty: 90 Tablet  Refills: 1     dapagliflozin propanediol 10 mg Tablet  Commonly known as: FARXIGA   10 mg, Oral, DAILY  Qty: 90 Tablet  Refills: 3     diclofenac sodium 1 % Gel  Commonly known as: VOLTAREN   Apply Topically, 4 TIMES DAILY  Qty: 2 g  Refills: 0     ergocalciferol (vitamin D2) 1,250 mcg (50,000 unit) Capsule  Commonly known as: DRISDOL   50,000 Units, Oral, EVERY 7 DAYS  Qty: 12 Capsule  Refills: 0     glimepiride 4 mg Tablet  Commonly known as: AMARYL   4 mg, Oral, 2 TIMES DAILY  Refills: 0     hydrALAZINE 25 mg Tablet  Commonly known as: APRESOLINE   25 mg, Oral, NIGHTLY  Qty: 90 Tablet  Refills: 0     metoprolol succinate 100 mg Tablet Sustained Release 24 hr  Commonly known as: TOPROL-XL   100 mg, Oral, DAILY  Qty: 90 Tablet  Refills: 1     multivitamin Tablet   1 Tablet, Oral, DAILY  Refills: 0     nitroGLYCERIN 0.3 mg Tablet, Sublingual  Commonly known as: NITROSTAT   0.3 mg, Sublingual, EVERY 5 MIN PRN, for 3 doses over 15 minutes  Refills: 0     omega 3-dha-epa-fish oil 183.3 mg-75 mg -91.6 mg-306 mg Capsule   1 Capsule, Oral, DAILY  Refills: 0     potassium chloride 10 mEq Tab Sust.Rel. Particle/Crystal  Commonly known as: K-DUR   10 mEq, Oral, DAILY  Qty: 90 Tablet  Refills: 1           * This list has 2 medication(s) that are the same as other medications prescribed for you. Read the directions carefully, and ask your doctor or other care provider to review them with you.                ASK your doctor about these medications.        Details   oxyCODONE-acetaminophen 10-325 mg  tablet  Commonly known as: PERCOCET  Ask about: Should I take this medication?   1 Tablet, Oral, EVERY 6 HOURS PRN  Qty: 12 Tablet  Refills: 0            Follow up:   Magdalene Patricia, MD  [address]  [place] [address]  [phone]      Follow-up with family doctor for recheck on Monday/Tuesday                 Clinical Impression   Right arm pain   Right wrist pain (Primary)   Gout of right wrist, unspecified cause, unspecified chronicity         Current Discharge Medication List        START taking these medications    Details   HYDROcodone-acetaminophen (NORCO) 5-325 mg Oral Tablet Take 1 Tablet by mouth Every 6 hours as needed for Pain  Qty: 12 Tablet, Refills: 0      predniSONE (DELTASONE) 20 mg Oral Tablet Take 1 Tablet (20 mg total) by mouth Three times a day for 5 days Take until relief of pain  Qty: 15 Tablet, Refills: 0             R.A. Santiago Bumpers, DO  Department of Emergency Medicine

## 2022-10-09 NOTE — ED Triage Notes (Signed)
Right arm pain that has been present for a couple of weeks. Pain in the hand and the lower arm. Denies any numbness/tingling. Pt takes Plavix and ASA. Hx of strokes.

## 2022-10-09 NOTE — Discharge Instructions (Addendum)
Wear wrist splint  Take prednisone 20 mg 3 times a day until pain is resolved  Take Lortab as needed for discomfort  Follow-up with family doctor for recheck on Monday/Tuesday  Return to emergency room for any fever, increased pain, numbness/tingling, or any concerns

## 2022-10-09 NOTE — ED Nurses Note (Signed)
Apply Volar short arm splint to patients right arm/wrist. Patient tolerated well. Educated patient on how to properly apply splint and patient verbalized understanding.

## 2022-10-09 NOTE — ED Nurses Note (Signed)
Patient discharged home with family.  AVS reviewed with patient/care giver.  A written copy of the AVS and discharge instructions was given to the patient/care giver.  Questions sufficiently answered as needed.  Patient/care giver encouraged to follow up with PCP as indicated.  In the event of an emergency, patient/care giver instructed to call 911 or go to the nearest emergency room.

## 2022-10-09 NOTE — ED APP Handoff Note (Signed)
[facility]  Emergency Department  Provider in Triage Note    Name: Matthew Sparks  Age: 67 y.o.  Gender: male     Subjective:   Matthew Sparks is a 67 y.o. male who presents with complaint of Arm Pain  .  Patient here with c/o R arm pain x 2 weeks. Was seen on 10/07/22 for same complaint    Objective:   Filed Vitals:    10/09/22 1816   BP: (!) 157/69   Pulse: 74   Resp: 18   Temp: 36.4 C (97.6 F)   SpO2: 99%        Assessment:  A medical screening exam was completed.  This patient is a 67 y.o. male with initial findings showing arm pain    Plan:  Please see initial orders and work-up below.  This is to be continued with full evaluation in the main Emergency Department.     No current facility-administered medications for this encounter.     No results found for this or any previous visit (from the past 24 hour(s)).     Sherlie Ban, FNP-BC  10/09/2022, 18:14

## 2022-10-10 ENCOUNTER — Other Ambulatory Visit: Payer: Medicare (Managed Care) | Attending: INTERNAL MEDICINE-ENDOCRINOLOGY-DIABETES AND METABOLISM

## 2022-10-10 ENCOUNTER — Other Ambulatory Visit (HOSPITAL_COMMUNITY): Payer: Self-pay | Admitting: NURSE PRACTITIONER

## 2022-10-10 ENCOUNTER — Inpatient Hospital Stay (HOSPITAL_BASED_OUTPATIENT_CLINIC_OR_DEPARTMENT_OTHER)
Admission: RE | Admit: 2022-10-10 | Discharge: 2022-10-10 | Disposition: A | Discharge status: Home / Self Care | Payer: Medicare (Managed Care) | Source: Ambulatory Visit | Attending: NURSE PRACTITIONER | Admitting: NURSE PRACTITIONER

## 2022-10-10 ENCOUNTER — Encounter (HOSPITAL_COMMUNITY): Payer: Self-pay

## 2022-10-10 DIAGNOSIS — E782 Mixed hyperlipidemia: Secondary | ICD-10-CM

## 2022-10-10 DIAGNOSIS — E1129 Type 2 diabetes mellitus with other diabetic kidney complication: Secondary | ICD-10-CM | POA: Insufficient documentation

## 2022-10-10 DIAGNOSIS — R5383 Other fatigue: Secondary | ICD-10-CM | POA: Insufficient documentation

## 2022-10-10 DIAGNOSIS — E1165 Type 2 diabetes mellitus with hyperglycemia: Secondary | ICD-10-CM | POA: Insufficient documentation

## 2022-10-10 DIAGNOSIS — I200 Unstable angina: Secondary | ICD-10-CM

## 2022-10-10 LAB — LIPID PANEL
CHOL/HDL RATIO: 3.9
CHOLESTEROL: 137 mg/dL (ref <200)
HDL CHOL: 35 mg/dL (ref 23–92)
LDL CALC: 79 mg/dL (ref 0–100)
TRIGLYCERIDES: 113 mg/dL (ref <=150)
VLDL CALC: 23 mg/dL (ref 0–50)

## 2022-10-10 LAB — BASIC METABOLIC PANEL
ANION GAP: 8 mmol/L (ref 4–13)
BUN/CREA RATIO: 18 (ref 6–22)
BUN: 34 mg/dL — ABNORMAL HIGH (ref 7–25)
CALCIUM: 9.3 mg/dL (ref 8.6–10.3)
CHLORIDE: 103 mmol/L (ref 98–107)
CO2 TOTAL: 29 mmol/L (ref 21–31)
CREATININE: 1.9 mg/dL — ABNORMAL HIGH (ref 0.60–1.30)
ESTIMATED GFR: 38 mL/min/{1.73_m2} — ABNORMAL LOW (ref >59)
GLUCOSE: 128 mg/dL — ABNORMAL HIGH (ref 74–109)
OSMOLALITY, CALCULATED: 289 mOsm/kg (ref 270–290)
POTASSIUM: 3.8 mmol/L (ref 3.5–5.1)
SODIUM: 140 mmol/L (ref 136–145)

## 2022-10-10 LAB — LIPASE: LIPASE: 87 U/L — ABNORMAL HIGH (ref 11–82)

## 2022-10-10 LAB — AMYLASE: AMYLASE: 70 U/L (ref 29–103)

## 2022-10-10 LAB — MAGNESIUM: MAGNESIUM: 2 mg/dL (ref 1.9–2.7)

## 2022-10-10 LAB — HGA1C (HEMOGLOBIN A1C WITH EST AVG GLUCOSE): HEMOGLOBIN A1C: 7.7 % — ABNORMAL HIGH (ref 4.0–6.0)

## 2022-10-10 LAB — MICROALBUMIN URINE, RANDOM: MICROALBUMIN RANDOM URINE: 6 mg/dL

## 2022-10-13 ENCOUNTER — Encounter (HOSPITAL_COMMUNITY): Payer: Self-pay

## 2022-10-13 DIAGNOSIS — I200 Unstable angina: Secondary | ICD-10-CM

## 2022-10-13 LAB — ECG 12 LEAD
Atrial Rate: 75 {beats}/min
Calculated P Axis: 74 degrees
Calculated R Axis: 82 degrees
Calculated T Axis: 13 degrees
PR Interval: 144 ms
QRS Duration: 80 ms
QT Interval: 396 ms
QTC Calculation: 442 ms
Ventricular rate: 75 {beats}/min

## 2022-10-16 ENCOUNTER — Other Ambulatory Visit: Payer: Self-pay

## 2022-10-16 ENCOUNTER — Encounter (INDEPENDENT_AMBULATORY_CARE_PROVIDER_SITE_OTHER): Payer: Self-pay

## 2022-10-16 ENCOUNTER — Ambulatory Visit (INDEPENDENT_AMBULATORY_CARE_PROVIDER_SITE_OTHER): Payer: Medicare (Managed Care)

## 2022-10-16 VITALS — Ht 70.0 in | Wt 218.5 lb

## 2022-10-16 DIAGNOSIS — I129 Hypertensive chronic kidney disease with stage 1 through stage 4 chronic kidney disease, or unspecified chronic kidney disease: Secondary | ICD-10-CM

## 2022-10-16 DIAGNOSIS — I10 Essential (primary) hypertension: Secondary | ICD-10-CM

## 2022-10-16 DIAGNOSIS — N1832 Chronic kidney disease, stage 3b (CMS HCC): Secondary | ICD-10-CM

## 2022-10-16 DIAGNOSIS — E790 Hyperuricemia without signs of inflammatory arthritis and tophaceous disease: Secondary | ICD-10-CM

## 2022-10-16 DIAGNOSIS — E119 Type 2 diabetes mellitus without complications: Secondary | ICD-10-CM

## 2022-10-16 DIAGNOSIS — E559 Vitamin D deficiency, unspecified: Secondary | ICD-10-CM

## 2022-10-16 DIAGNOSIS — E1122 Type 2 diabetes mellitus with diabetic chronic kidney disease: Secondary | ICD-10-CM

## 2022-10-16 MED ORDER — ALLOPURINOL 100 MG TABLET
100.0000 mg | ORAL_TABLET | Freq: Every day | ORAL | 3 refills | Status: DC
Start: 2022-10-16 — End: 2023-02-16

## 2022-10-16 MED ORDER — DAPAGLIFLOZIN PROPANEDIOL 10 MG TABLET
10.0000 mg | ORAL_TABLET | Freq: Every day | ORAL | 3 refills | Status: AC
Start: 2022-10-16 — End: 2023-10-16

## 2022-10-16 NOTE — Progress Notes (Signed)
NEPHROLOGY, [facility]  [address] [place] [address]    Progress Note    Name: Matthew Sparks MRN:  [id number]   Date: 10/16/2022 DOB:  May 10, 1956 (67 y.o.)              Nephrology Out  Patient Visit       HPI : 67 y.o. male presents to the office for chronic kidney disease stage 3 B past medical history of diabetes type 2 essential hypertension, history of hyperlipidemia.  Denies NSAID use.  Denies nausea vomiting.  Reports he has not had any problems with edema.  Blood pressure has been under control.  Hemoglobin A1c is 7.7.  Patient stated he was on prednisone for a labs were drawn.  He has a Dexcom sensor device to monitor blood sugar .  States blood sugar is better control.  Patient states he recently stopped taking Ozempic due to side effects.  Denies hematuria or dysuria.  Educated patient on Farxiga.        ROS:     Systematic review of 12 organ systems was negative except what mentioned in in the HPI.     OBJECTIVE:   Ht 1.778 m (5\' 10" )   Wt 99.1 kg (218 lb 7.6 oz)   BMI 31.35 kg/m       General:  NAD, AAOx3  HEENT:  EOMI,  no icterus  NECK: No increased JVD.  Normal inspection   HEART: Regular rhythm. No murmurs or rubs. No chest wall tenderness   LUNGS: Clear to auscultation bilateral. No wheezes, rales, or rhonchi.   ABDOMEN: +BS, Soft, nontender and nondistended. No rebound or guarding present.   EXTREMITIES: No edema. No cyanosis or clubbing.No asterixis    NEURO : Normal speech, EOMI.       LABORATORY DATA:   Lab Results   Component Value Date    BUN 34 (H) 10/10/2022    BUN 34 (H) 10/09/2022    BUN 33 (H) 10/08/2022    CREATININE 1.90 (H) 10/10/2022    CREATININE 2.00 (H) 10/09/2022    CREATININE 1.81 (H) 10/08/2022    BUNCRRATIO 18 10/10/2022    BUNCRRATIO 17 10/09/2022    BUNCRRATIO 18 10/08/2022    GFR 38 (L) 10/10/2022    GFR 36 (L) 10/09/2022    GFR 40 (L) 10/08/2022    SODIUM 140 10/10/2022    SODIUM 139 10/09/2022    SODIUM 139 10/08/2022    POTASSIUM 3.8 10/10/2022     POTASSIUM 3.8 10/09/2022    POTASSIUM 3.5 10/08/2022    CHLORIDE 103 10/10/2022    CHLORIDE 104 10/09/2022    CHLORIDE 102 10/08/2022    CO2 29 10/10/2022    CO2 29 10/09/2022    CO2 26 10/08/2022    ANIONGAP 8 10/10/2022    ANIONGAP 6 10/09/2022    ANIONGAP 11 10/08/2022    CALCIUM 9.3 10/10/2022    CALCIUM 9.3 10/09/2022    CALCIUM 10.1 10/08/2022    PHOSPHORUS 3.4 (L) 10/08/2022    PHOSPHORUS 3.8 03/27/2022    ALBUMIN 4.5 10/09/2022    ALBUMIN 4.8 10/08/2022    ALBUMIN 4.2 09/09/2022    HGB 12.9 10/09/2022    HGB 13.2 10/08/2022    HGB 13.1 03/27/2022    HCT 38.9 10/09/2022    HCT 40.4 10/08/2022    HCT 40.4 03/27/2022    INTACTPTH 47.4 10/08/2022    INTACTPTH 42.4 03/27/2022    INTACTPTH 42.3 12/26/2021  IRON 112 10/08/2022    IRONBINDCAP 424 10/08/2022    IRONSAT 26 10/08/2022           MEDICATIONS:  Outpatient Medications Marked as Taking for the 10/16/22 encounter (Office Visit) with Cydney Ok, NP   Medication Sig    amLODIPine (NORVASC) 10 mg Oral Tablet Take 1 Tablet (10 mg total) by mouth Once a day Indications: high blood pressure    aspirin 325 mg Oral Tablet Take 81 mg by mouth Once a day     atorvastatin (LIPITOR) 80 mg Oral Tablet Take 1 Tablet (80 mg total) by mouth Every evening    chlorTHALIDONE (HYGROTON) 50 mg Oral Tablet Take 1 Tablet (50 mg total) by mouth Once a day    clopidogreL (PLAVIX) 75 mg Oral Tablet Take 1 Tablet (75 mg total) by mouth Once a day    dapagliflozin propanediol (FARXIGA) 10 mg Oral Tablet Take 1 Tablet (10 mg total) by mouth Once a day Indications: chronic kidney disease with albuminuria    DEXCOM G7 SENSOR Does not apply Device REPLACE SENSOR EVERY 10 DAYS    ergocalciferol, vitamin D2, (DRISDOL) 1,250 mcg (50,000 unit) Oral Capsule Take 1 Capsule (50,000 Units total) by mouth Every 7 days    glimepiride (AMARYL) 4 mg Oral Tablet Take 1 Tablet (4 mg total) by mouth Twice daily    HYDROcodone-acetaminophen (NORCO) 5-325 mg Oral Tablet Take 1 Tablet by mouth Every  6 hours as needed for Pain    LANTUS SOLOSTAR U-100 INSULIN 100 unit/mL (3 mL) Subcutaneous Insulin Pen     losartan (COZAAR) 25 mg Oral Tablet Take 1 Tablet (25 mg total) by mouth Once a day    metoprolol succinate (TOPROL-XL) 100 mg Oral Tablet Sustained Release 24 hr Take 1 Tablet (100 mg total) by mouth Once a day    multivitamin Oral Tablet Take 1 Tablet by mouth Once a day    omega 3-dha-epa-fish oil 183.3 mg-75 mg -91.6 mg-306 mg Oral Capsule Take 1 Capsule by mouth Once a day    potassium chloride (K-DUR) 10 mEq Oral Tab Sust.Rel. Particle/Crystal Take 1 Tablet (10 mEq total) by mouth Once a day     Current Outpatient Medications   Medication Instructions    acetaminophen (TYLENOL) 500 mg, Oral, EVERY 6 HOURS PRN    albuterol (PROVENTIL) 2.5 mg, Nebulization, 3 TIMES DAILY    albuterol sulfate (PROVENTIL OR VENTOLIN OR PROAIR) 90 mcg/actuation Inhalation HFA Aerosol Inhaler 1-2 Puffs, Inhalation, EVERY 6 HOURS PRN    amLODIPine (NORVASC) 10 mg, Oral, DAILY    aspirin 81 mg, Oral, DAILY    atorvastatin (LIPITOR) 80 mg, Oral, EVERY EVENING    chlorTHALIDONE (HYGROTON) 50 mg, Oral, DAILY    clopidogreL (PLAVIX) 75 mg, Oral, DAILY    dapagliflozin propanediol (FARXIGA) 10 mg, Oral, DAILY    DEXCOM G7 SENSOR Does not apply Device REPLACE SENSOR EVERY 10 DAYS    ergocalciferol (vitamin D2) (DRISDOL) 50,000 Units, Oral, EVERY 7 DAYS    glimepiride (AMARYL) 4 mg, Oral, 2 TIMES DAILY    HYDROcodone-acetaminophen (NORCO) 5-325 mg Oral Tablet 1 Tablet, Oral, EVERY 6 HOURS PRN    LANTUS SOLOSTAR U-100 INSULIN 100 unit/mL (3 mL) Subcutaneous Insulin Pen     losartan (COZAAR) 25 mg, Oral, DAILY    metoprolol succinate (TOPROL-XL) 100 mg, Oral, DAILY    multivitamin Oral Tablet 1 Tablet, Oral, DAILY    nitroGLYCERIN (NITROSTAT) 0.3 mg, Sublingual, EVERY 5 MIN PRN, for 3 doses over 15  minutes    omega 3-dha-epa-fish oil 183.3 mg-75 mg -91.6 mg-306 mg Oral Capsule 1 Capsule, Oral, DAILY    potassium chloride (K-DUR) 10 mEq  Oral Tab Sust.Rel. Particle/Crystal 10 mEq, Oral, DAILY       ASSESSMENT / PLAN:   ENCOUNTER DIAGNOSES     ICD-10-CM   1. 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    -Avoid NSAIDs.  Tylenol only for pain  -Renal imaging:  No hydronephrosis normal ultrasound  -ACEI or ARBS:  yes  -continue losartan 25 mg daily  -Sodium Glucose Cotransporter-2 (SGLT2) Inhibitors: yes  -continue Farxiga 10 mg daily         -Vitamin-D deficiency  -Vitamin D level  at goal  -monitor vitamin-D level      Hypertension  -Blood pressure is acceptable  -Goal less than 130/80  -Continue losartan 25 mg daily  -continue amlodipine 10 mg daily  -chlorthalidone 50 mg daily  -Low-salt diet      Diabetes type 2  -A1c goal is less than 7   Lab Results   Component Value Date    HA1C 7.7 (H) 10/10/2022      -Continue current medications  -Diabetic diet  -Increase activity and exercise as possible  -Monitor A1C        Hyperuricemia  -Check uric acid   Lab Results   Component Value Date    URICACID 10.4 (H) 10/09/2022       -Low uric acid diet  -allopurinol 100 mg daily          I have discussed with the patient the following issues:  The main goal of treatment is to prevent progression of CKD to complete kidney failure. The best way to do this is to control the underlying cause.  the optimal diet is one similar to the Dietary Approaches to Stop Hypertension (DASH) diet, consisting of fruits, vegetables, legumes, fish, poultry, and whole grains.  Restrict sodium intake to less than 2 g/day      Orders Placed This Encounter    BASIC METABOLIC PANEL    CBC/DIFF    MAGNESIUM    MICROALBUMIN/CREATININE  RATIO, URINE, RANDOM    PARATHYROID HORMONE (PTH)    URIC ACID    PROTEIN/CREATININE RATIO, URINE, RANDOM    VITAMIN D 25 TOTAL    allopurinoL (ZYLOPRIM) 100 mg Oral Tablet    dapagliflozin propanediol (FARXIGA) 10 mg Oral Tablet         Cydney Ok, NP

## 2022-11-20 ENCOUNTER — Other Ambulatory Visit (INDEPENDENT_AMBULATORY_CARE_PROVIDER_SITE_OTHER): Payer: Self-pay | Admitting: Internal Medicine

## 2023-01-26 ENCOUNTER — Other Ambulatory Visit: Payer: Self-pay

## 2023-01-26 ENCOUNTER — Emergency Department
Admission: EM | Admit: 2023-01-26 | Discharge: 2023-01-26 | Discharge status: Against Medical Advice | Payer: Medicare (Managed Care)

## 2023-01-26 DIAGNOSIS — Z5321 Procedure and treatment not carried out due to patient leaving prior to being seen by health care provider: Secondary | ICD-10-CM | POA: Insufficient documentation

## 2023-01-26 NOTE — ED Nurses Note (Signed)
Patient left without being seen.

## 2023-01-26 NOTE — ED Triage Notes (Signed)
Right wrist pain that radiates up into the arm, seen for this same c/o a while a go. States was given a brace but it does not help.

## 2023-01-27 ENCOUNTER — Emergency Department (HOSPITAL_COMMUNITY): Payer: Medicare (Managed Care)

## 2023-01-27 ENCOUNTER — Emergency Department
Admission: EM | Admit: 2023-01-27 | Discharge: 2023-01-27 | Disposition: A | Discharge status: Home / Self Care | Payer: Medicare (Managed Care) | Attending: NURSE PRACTITIONER | Admitting: NURSE PRACTITIONER

## 2023-01-27 DIAGNOSIS — M19031 Primary osteoarthritis, right wrist: Secondary | ICD-10-CM | POA: Insufficient documentation

## 2023-01-27 DIAGNOSIS — M25531 Pain in right wrist: Secondary | ICD-10-CM | POA: Insufficient documentation

## 2023-01-27 MED ORDER — TRAMADOL 50 MG TABLET
50.0000 mg | ORAL_TABLET | ORAL | Status: AC
Start: 2023-01-27 — End: 2023-01-27
  Administered 2023-01-27: 50 mg via ORAL

## 2023-01-27 MED ORDER — TRAMADOL 50 MG TABLET
ORAL_TABLET | ORAL | Status: AC
Start: 2023-01-27 — End: 2023-01-27
  Filled 2023-01-27: qty 1
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
## 2023-01-27 NOTE — Nurses Notes (Signed)
PAtient discharged to home at this time after evaluation by provider

## 2023-01-27 NOTE — ED Triage Notes (Signed)
Patient reports right wrist pain for a few days, unable to bend wrist or move fingers. No injury noted.

## 2023-02-09 ENCOUNTER — Other Ambulatory Visit: Payer: Medicare (Managed Care)

## 2023-02-09 ENCOUNTER — Other Ambulatory Visit: Payer: Self-pay

## 2023-02-09 ENCOUNTER — Ambulatory Visit (INDEPENDENT_AMBULATORY_CARE_PROVIDER_SITE_OTHER): Payer: Medicare (Managed Care)

## 2023-02-09 DIAGNOSIS — N1832 Chronic kidney disease, stage 3b (CMS HCC): Secondary | ICD-10-CM

## 2023-02-09 DIAGNOSIS — E559 Vitamin D deficiency, unspecified: Secondary | ICD-10-CM | POA: Insufficient documentation

## 2023-02-09 LAB — BASIC METABOLIC PANEL
ANION GAP: 7 mmol/L (ref 4–13)
BUN/CREA RATIO: 15 (ref 6–22)
BUN: 29 mg/dL — ABNORMAL HIGH (ref 7–25)
CALCIUM: 9.8 mg/dL (ref 8.6–10.3)
CHLORIDE: 105 mmol/L (ref 98–107)
CO2 TOTAL: 28 mmol/L (ref 21–31)
CREATININE: 1.89 mg/dL — ABNORMAL HIGH (ref 0.60–1.30)
ESTIMATED GFR: 38 mL/min/{1.73_m2} — ABNORMAL LOW (ref >59)
GLUCOSE: 113 mg/dL — ABNORMAL HIGH (ref 74–109)
OSMOLALITY, CALCULATED: 286 mOsm/kg (ref 270–290)
POTASSIUM: 4 mmol/L (ref 3.5–5.1)
SODIUM: 140 mmol/L (ref 136–145)

## 2023-02-09 LAB — CBC WITH DIFF
BASOPHIL #: 0.1 10*3/uL (ref 0.00–0.10)
BASOPHIL %: 1 % (ref 0–1)
EOSINOPHIL #: 0.3 10*3/uL (ref 0.00–0.50)
EOSINOPHIL %: 3 % (ref 1–8)
HCT: 41.8 % (ref 36.7–47.1)
HGB: 13.5 g/dL (ref 12.5–16.3)
LYMPHOCYTE #: 2.6 10*3/uL (ref 1.00–3.00)
LYMPHOCYTE %: 33 % (ref 16–44)
MCH: 28.6 pg (ref 23.8–33.4)
MCHC: 32.3 g/dL — ABNORMAL LOW (ref 32.5–36.3)
MCV: 88.5 fL (ref 73.0–96.2)
MONOCYTE #: 0.6 10*3/uL (ref 0.30–1.00)
MONOCYTE %: 8 % (ref 5–13)
MPV: 10.5 fL (ref 7.4–11.4)
NEUTROPHIL #: 4.4 10*3/uL (ref 1.85–7.80)
NEUTROPHIL %: 56 % (ref 43–77)
PLATELETS: 178 10*3/uL (ref 140–440)
RBC: 4.72 10*6/uL (ref 4.06–5.63)
RDW: 13.9 % (ref 12.1–16.2)
WBC: 8 10*3/uL (ref 3.6–10.2)

## 2023-02-09 LAB — PARATHYROID HORMONE (PTH): PTH: 47.7 pg/mL (ref 12.0–88.0)

## 2023-02-09 LAB — MICROALBUMIN/CREATININE RATIO, URINE, RANDOM
CREATININE RANDOM URINE: 120 mg/dL (ref 30–125)
MICROALBUMIN RANDOM URINE: 22.6 mg/dL
MICROALBUMIN/CREATININE RATIO RANDOM URINE: 188.3 mg/g

## 2023-02-09 LAB — PROTEIN/CREATININE RATIO, URINE, RANDOM
CREATININE RANDOM URINE: 120 mg/dL (ref 30–125)
PROTEIN RANDOM URINE: 34 mg/dL — ABNORMAL LOW (ref 50–80)
PROTEIN/CREATININE RATIO RANDOM URINE: 0.283 mg/mg (ref 0.000–200.000)

## 2023-02-09 LAB — MAGNESIUM: MAGNESIUM: 1.7 mg/dL — ABNORMAL LOW (ref 1.9–2.7)

## 2023-02-09 LAB — URIC ACID: URIC ACID: 8.2 mg/dL — ABNORMAL HIGH (ref 2.3–7.6)

## 2023-02-09 LAB — VITAMIN D 25 TOTAL: VITAMIN D 25, TOTAL: 49.49 ng/mL (ref 30.00–100.00)

## 2023-02-16 ENCOUNTER — Ambulatory Visit (INDEPENDENT_AMBULATORY_CARE_PROVIDER_SITE_OTHER): Payer: Medicare (Managed Care)

## 2023-02-16 ENCOUNTER — Other Ambulatory Visit: Payer: Self-pay

## 2023-02-16 ENCOUNTER — Encounter (INDEPENDENT_AMBULATORY_CARE_PROVIDER_SITE_OTHER): Payer: Self-pay

## 2023-02-16 VITALS — BP 130/62 | HR 80 | Ht 70.0 in | Wt 216.0 lb

## 2023-02-16 DIAGNOSIS — Z794 Long term (current) use of insulin: Secondary | ICD-10-CM

## 2023-02-16 DIAGNOSIS — E559 Vitamin D deficiency, unspecified: Secondary | ICD-10-CM

## 2023-02-16 DIAGNOSIS — E790 Hyperuricemia without signs of inflammatory arthritis and tophaceous disease: Secondary | ICD-10-CM

## 2023-02-16 DIAGNOSIS — I129 Hypertensive chronic kidney disease with stage 1 through stage 4 chronic kidney disease, or unspecified chronic kidney disease: Secondary | ICD-10-CM

## 2023-02-16 DIAGNOSIS — N1832 Chronic kidney disease, stage 3b (CMS HCC): Secondary | ICD-10-CM

## 2023-02-16 DIAGNOSIS — I10 Essential (primary) hypertension: Secondary | ICD-10-CM

## 2023-02-16 DIAGNOSIS — E1122 Type 2 diabetes mellitus with diabetic chronic kidney disease: Secondary | ICD-10-CM

## 2023-02-16 MED ORDER — ALLOPURINOL 100 MG TABLET
100.0000 mg | ORAL_TABLET | Freq: Every day | ORAL | 2 refills | Status: DC
Start: 2023-02-16 — End: 2023-06-18

## 2023-02-16 NOTE — Progress Notes (Unsigned)
NEPHROLOGY, [facility]  [address] [place] [address]    Progress Note    Name: Matthew Sparks MRN:  [id number]   Date: 02/16/2023 DOB:  [birth year] (67 y.o.)              Nephrology Out  Patient Visit       HPI : 67 y.o.   male here fpr chronic kidney disease stage IIIB creatinine 1.89 GFR 38.  Past medical history chronic kidney disease, hypertension, insulin-dependent diabetes mellitus type II, and hyperlipidemia. Denies complaints of edema. No edema noted today.  Currently taking [place]. Hematuria and dysuria.  Not currently taking NSAIDs.  Last hemoglobin A1c was 7.7.  States blood pressure has been under good control running 120s to 130s systolic.         ROS:      Assessment  was negative except what mentioned in in the HPI.       OBJECTIVE:   BP 130/62   Pulse 80   Ht 1.778 m (5\' 10" )   Wt 98 kg (216 lb)   BMI 30.99 kg/m       General:  NAD, AAOx3  HEENT:  EOMI,  no icterus  NECK: No increased JVD.  Normal inspection   HEART:   Regular rhythm. No murmurs or rubs. No chest wall tenderness   LUNGS: Clear to auscultation bilateral. No wheezes, rales, or rhonchi.   ABDOMEN: +BS, Soft, nontender and nondistended. No rebound or guarding present.   EXTREMITIES: No edema. No cyanosis or clubbing.No asterixis    NEURO : Normal speech, EOMI.       LABORATORY DATA:   Lab Results   Component Value Date    BUN 29 (H) 02/09/2023    BUN 34 (H) 10/10/2022    BUN 34 (H) 10/09/2022    CREATININE 1.89 (H) 02/09/2023    CREATININE 1.90 (H) 10/10/2022    CREATININE 2.00 (H) 10/09/2022    BUNCRRATIO 15 02/09/2023    BUNCRRATIO 18 10/10/2022    BUNCRRATIO 17 10/09/2022    GFR 38 (L) 02/09/2023    GFR 38 (L) 10/10/2022    GFR 36 (L) 10/09/2022    SODIUM 140 02/09/2023    SODIUM 140 10/10/2022    SODIUM 139 10/09/2022    POTASSIUM 4.0 02/09/2023    POTASSIUM 3.8 10/10/2022    POTASSIUM 3.8 10/09/2022    CHLORIDE 105 02/09/2023    CHLORIDE 103 10/10/2022    CHLORIDE 104 10/09/2022    CO2 28 02/09/2023    CO2 29  10/10/2022    CO2 29 10/09/2022    ANIONGAP 7 02/09/2023    ANIONGAP 8 10/10/2022    ANIONGAP 6 10/09/2022    CALCIUM 9.8 02/09/2023    CALCIUM 9.3 10/10/2022    CALCIUM 9.3 10/09/2022    PHOSPHORUS 3.4 (L) 10/08/2022    PHOSPHORUS 3.8 03/27/2022    ALBUMIN 4.5 10/09/2022    ALBUMIN 4.8 10/08/2022    ALBUMIN 4.2 09/09/2022    HGB 13.5 02/09/2023    HGB 12.9 10/09/2022    HGB 13.2 10/08/2022    HCT 41.8 02/09/2023    HCT 38.9 10/09/2022    HCT 40.4 10/08/2022    INTACTPTH 47.7 02/09/2023    INTACTPTH 47.4 10/08/2022    INTACTPTH 42.4 03/27/2022    IRON 112 10/08/2022    IRONBINDCAP 424 10/08/2022    IRONSAT 26 10/08/2022           MEDICATIONS:  No outpatient medications  have been marked as taking for the 02/16/23 encounter (Office Visit) with Cydney Ok, NP.     Current Outpatient Medications   Medication Instructions    acetaminophen (TYLENOL) 500 mg, Oral, EVERY 6 HOURS PRN    albuterol (PROVENTIL) 2.5 mg, Nebulization, 3 TIMES DAILY    albuterol sulfate (PROVENTIL OR VENTOLIN OR PROAIR) 90 mcg/actuation Inhalation HFA Aerosol Inhaler 1-2 Puffs, Inhalation, EVERY 6 HOURS PRN    allopurinoL (ZYLOPRIM) 100 mg, Oral, DAILY    amLODIPine (NORVASC) 10 mg, Oral, DAILY    aspirin 81 mg, Oral, DAILY    atorvastatin (LIPITOR) 80 mg, Oral, EVERY EVENING    chlorTHALIDONE (HYGROTON) 50 mg, Oral, DAILY    clopidogreL (PLAVIX) 75 mg, Oral, DAILY    dapagliflozin propanediol (FARXIGA) 10 mg, Oral, DAILY    DEXCOM G7 SENSOR Does not apply Device REPLACE SENSOR EVERY 10 DAYS    ergocalciferol (vitamin D2) (DRISDOL) 50,000 Units, Oral, EVERY 7 DAYS    glimepiride (AMARYL) 4 mg, Oral, 2 TIMES DAILY    HYDROcodone-acetaminophen (NORCO) 5-325 mg Oral Tablet 1 Tablet, Oral, EVERY 6 HOURS PRN    LANTUS SOLOSTAR U-100 INSULIN 100 unit/mL (3 mL) Subcutaneous Insulin Pen     losartan (COZAAR) 25 mg, Oral, DAILY    metoprolol succinate (TOPROL-XL) 100 mg, Oral, DAILY    multivitamin Oral Tablet 1 Tablet, Oral, DAILY    nitroGLYCERIN  (NITROSTAT) 0.3 mg, Sublingual, EVERY 5 MIN PRN, for 3 doses over 15 minutes    omega 3-dha-epa-fish oil 183.3 mg-75 mg -91.6 mg-306 mg Oral Capsule 1 Capsule, Oral, DAILY    potassium chloride (K-DUR) 10 mEq Oral Tab Sust.Rel. Particle/Crystal 10 mEq, Oral, DAILY       ASSESSMENT / PLAN:   ENCOUNTER DIAGNOSES     ICD-10-CM   1. 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 Hypertension, unspecified type  I10   3. Hypomagnesemia  E83.42   4. Vitamin D deficiency  E55.9            Chronic kidney disease  -Stage IIIb  -Due to diabetes and hypertension  -Creatinine is 1.89/GFR 38  -Baseline creatinine :  1.92  -Total protein to creatinine ratio: 0.28  -CBC and a basic metabolic panel  -Return to clinic in 3 months  -Continue low-sodium diet  -balanced Fluid intake 40-50 oz a day.    -Avoid NSAIDs.  Tylenol only for pain  -Renal imaging: No hydronephrosis no cysts or large masses  -ACEI or ARBS:  Losartan 25 mg daily  -Sodium Glucose Cotransporter-2 (SGLT2) Inhibitors: Y  N contraindicated    CKD bone mineral disease  -PTH   PTH   Date Value Ref Range Status   02/09/2023 47.7 12.0 - 88.0 pg/mL Final      -Vitamin D level No results found for: "VITD25"]   -Phosphorus level.   Lab Results   Component Value Date    PHOSPHORUS 3.4 (L) 10/08/2022         Hypertension  -Blood pressure is acceptable  -Goal less than 130/80  -Continue losartan 25 mg daily  -continue amlodipine 10 mg daily  -chlorthalidone 50 mg daily  -Low-salt diet    Diabetes type 2  -A1c goal is less than 7   Lab Results   Component Value Date    HA1C 7.7 (H) 10/10/2022      -Continue Farxiga 10 mg daily  -continue sliding scale insulin  -Diabetic diet  -Increase activity and exercise as possible  -  Monitor A1C        Hyperuricemia  -Check uric acid   Lab Results   Component Value Date    URICACID 8.2 (H) 02/09/2023       -Low uric acid diet        I have discussed with the patient the following issues:  The main goal of treatment is to prevent  progression of CKD to complete kidney failure. The best way to do this is to control the underlying cause.  the optimal diet is one similar to the Dietary Approaches to Stop Hypertension (DASH) diet, consisting of fruits, vegetables, legumes, fish, poultry, and whole grains.  Restrict sodium intake to less than 2 g/day        Orders Placed This Encounter    BASIC METABOLIC PANEL    CBC/DIFF    MAGNESIUM    MICROALBUMIN/CREATININE RATIO, URINE, RANDOM    PARATHYROID HORMONE (PTH)    PROTEIN/CREATININE RATIO, URINE, RANDOM    URIC ACID    VITAMIN D 25 TOTAL       Cydney Ok, NP

## 2023-03-16 ENCOUNTER — Emergency Department (HOSPITAL_COMMUNITY): Payer: Medicare (Managed Care)

## 2023-03-16 ENCOUNTER — Other Ambulatory Visit: Payer: Self-pay

## 2023-03-16 ENCOUNTER — Encounter (HOSPITAL_COMMUNITY): Payer: Self-pay

## 2023-03-16 ENCOUNTER — Emergency Department
Admission: EM | Admit: 2023-03-16 | Discharge: 2023-03-16 | Disposition: A | Discharge status: Home / Self Care | Payer: Medicare (Managed Care) | Attending: Family | Admitting: Family

## 2023-03-16 DIAGNOSIS — Z794 Long term (current) use of insulin: Secondary | ICD-10-CM | POA: Insufficient documentation

## 2023-03-16 DIAGNOSIS — R609 Edema, unspecified: Secondary | ICD-10-CM

## 2023-03-16 DIAGNOSIS — E119 Type 2 diabetes mellitus without complications: Secondary | ICD-10-CM | POA: Insufficient documentation

## 2023-03-16 DIAGNOSIS — M19031 Primary osteoarthritis, right wrist: Secondary | ICD-10-CM | POA: Insufficient documentation

## 2023-03-16 DIAGNOSIS — M19011 Primary osteoarthritis, right shoulder: Secondary | ICD-10-CM | POA: Insufficient documentation

## 2023-03-16 DIAGNOSIS — M25531 Pain in right wrist: Secondary | ICD-10-CM | POA: Insufficient documentation

## 2023-03-16 DIAGNOSIS — M79601 Pain in right arm: Secondary | ICD-10-CM

## 2023-03-16 DIAGNOSIS — M25431 Effusion, right wrist: Secondary | ICD-10-CM | POA: Insufficient documentation

## 2023-03-16 DIAGNOSIS — M19021 Primary osteoarthritis, right elbow: Secondary | ICD-10-CM | POA: Insufficient documentation

## 2023-03-16 MED ORDER — PREDNISONE 20 MG TABLET
40.0000 mg | ORAL_TABLET | ORAL | Status: AC
Start: 2023-03-16 — End: 2023-03-16
  Administered 2023-03-16: 40 mg via ORAL

## 2023-03-16 MED ORDER — HYDROCODONE 5 MG-ACETAMINOPHEN 325 MG TABLET
1.0000 | ORAL_TABLET | ORAL | Status: AC
Start: 2023-03-16 — End: 2023-03-16
  Administered 2023-03-16: 1 via ORAL

## 2023-03-16 MED ORDER — HYDROCODONE 5 MG-ACETAMINOPHEN 325 MG TABLET
ORAL_TABLET | ORAL | Status: AC
Start: 2023-03-16 — End: 2023-03-16
  Filled 2023-03-16: qty 1

## 2023-03-16 MED ORDER — PREDNISONE 20 MG TABLET
ORAL_TABLET | ORAL | Status: AC
Start: 2023-03-16 — End: 2023-03-16
  Filled 2023-03-16: qty 2

## 2023-03-16 NOTE — ED Triage Notes (Signed)
Right arm pain constant for 3 months 10/10.
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
## 2023-03-16 NOTE — ED Nurses Note (Signed)
Pt refused splint. Pt given verbal and written discharge instructions, verbalized understanding.

## 2023-04-09 ENCOUNTER — Ambulatory Visit: Payer: Medicare (Managed Care) | Attending: INTERNAL MEDICINE-ENDOCRINOLOGY-DIABETES AND METABOLISM

## 2023-04-09 ENCOUNTER — Other Ambulatory Visit: Payer: Self-pay

## 2023-04-09 DIAGNOSIS — R748 Abnormal levels of other serum enzymes: Secondary | ICD-10-CM | POA: Insufficient documentation

## 2023-04-09 DIAGNOSIS — R5383 Other fatigue: Secondary | ICD-10-CM | POA: Insufficient documentation

## 2023-04-09 DIAGNOSIS — E079 Disorder of thyroid, unspecified: Secondary | ICD-10-CM | POA: Insufficient documentation

## 2023-04-09 DIAGNOSIS — E119 Type 2 diabetes mellitus without complications: Secondary | ICD-10-CM | POA: Insufficient documentation

## 2023-04-09 LAB — AMYLASE: AMYLASE: 78 U/L (ref 29–103)

## 2023-04-09 LAB — URIC ACID: URIC ACID: 8 mg/dL — ABNORMAL HIGH (ref 2.3–7.6)

## 2023-04-09 LAB — MICROALBUMIN URINE, RANDOM: MICROALBUMIN RANDOM URINE: 80.1 mg/dL

## 2023-04-09 LAB — LIPASE: LIPASE: 103 U/L — ABNORMAL HIGH (ref 11–82)

## 2023-04-09 LAB — HGA1C (HEMOGLOBIN A1C WITH EST AVG GLUCOSE): HEMOGLOBIN A1C: 7.9 % — ABNORMAL HIGH (ref 4.0–6.0)

## 2023-04-13 LAB — THYROID STIMULATING HORMONE (SENSITIVE TSH): TSH: 4.182 u[IU]/mL (ref 0.450–5.330)

## 2023-05-11 ENCOUNTER — Inpatient Hospital Stay
Admission: RE | Admit: 2023-05-11 | Discharge: 2023-05-11 | Disposition: A | Discharge status: Home / Self Care | Payer: Medicare (Managed Care) | Source: Ambulatory Visit | Attending: NURSE PRACTITIONER | Admitting: NURSE PRACTITIONER

## 2023-05-11 ENCOUNTER — Other Ambulatory Visit (HOSPITAL_COMMUNITY): Payer: Self-pay | Admitting: NURSE PRACTITIONER

## 2023-05-11 ENCOUNTER — Other Ambulatory Visit: Payer: Self-pay

## 2023-05-11 DIAGNOSIS — R9431 Abnormal electrocardiogram [ECG] [EKG]: Secondary | ICD-10-CM

## 2023-05-11 DIAGNOSIS — R079 Chest pain, unspecified: Secondary | ICD-10-CM

## 2023-05-11 LAB — ECG 12 LEAD
Atrial Rate: 69 {beats}/min
Calculated P Axis: 64 degrees
Calculated R Axis: 70 degrees
Calculated T Axis: 50 degrees
PR Interval: 168 ms
QRS Duration: 96 ms
QT Interval: 392 ms
QTC Calculation: 420 ms
Ventricular rate: 69 {beats}/min

## 2023-05-13 ENCOUNTER — Encounter (INDEPENDENT_AMBULATORY_CARE_PROVIDER_SITE_OTHER): Payer: Self-pay | Admitting: Surgery

## 2023-05-14 ENCOUNTER — Encounter (INDEPENDENT_AMBULATORY_CARE_PROVIDER_SITE_OTHER): Payer: Self-pay | Admitting: Surgery

## 2023-05-20 ENCOUNTER — Encounter (INDEPENDENT_AMBULATORY_CARE_PROVIDER_SITE_OTHER): Payer: Self-pay | Admitting: Surgery

## 2023-05-20 ENCOUNTER — Other Ambulatory Visit: Payer: Self-pay

## 2023-05-20 ENCOUNTER — Ambulatory Visit (INDEPENDENT_AMBULATORY_CARE_PROVIDER_SITE_OTHER): Payer: Medicare (Managed Care) | Admitting: Surgery

## 2023-05-20 VITALS — BP 130/69 | HR 74 | Temp 97.5°F | Resp 18 | Ht 70.0 in | Wt 214.0 lb

## 2023-05-20 DIAGNOSIS — Z1211 Encounter for screening for malignant neoplasm of colon: Secondary | ICD-10-CM

## 2023-05-20 DIAGNOSIS — N1832 Chronic kidney disease, stage 3b (CMS HCC): Secondary | ICD-10-CM

## 2023-05-20 DIAGNOSIS — I10 Essential (primary) hypertension: Secondary | ICD-10-CM

## 2023-05-20 DIAGNOSIS — Z794 Long term (current) use of insulin: Secondary | ICD-10-CM

## 2023-05-20 DIAGNOSIS — Z79899 Other long term (current) drug therapy: Secondary | ICD-10-CM

## 2023-05-20 MED ORDER — PEG 3350-ELECTROLYTES 236 GRAM-22.74 GRAM-6.74 GRAM-5.86 GRAM SOLUTION
4.0000 L | Freq: Once | ORAL | 0 refills | Status: AC
Start: 2023-05-20 — End: 2023-05-20

## 2023-05-21 ENCOUNTER — Encounter (INDEPENDENT_AMBULATORY_CARE_PROVIDER_SITE_OTHER): Payer: Self-pay | Admitting: Surgery
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
## 2023-06-10 ENCOUNTER — Encounter (INDEPENDENT_AMBULATORY_CARE_PROVIDER_SITE_OTHER): Payer: Self-pay | Admitting: NEUROLOGY

## 2023-06-11 ENCOUNTER — Other Ambulatory Visit (INDEPENDENT_AMBULATORY_CARE_PROVIDER_SITE_OTHER): Payer: Self-pay

## 2023-06-11 ENCOUNTER — Other Ambulatory Visit: Payer: Self-pay

## 2023-06-11 ENCOUNTER — Other Ambulatory Visit: Payer: Medicare (Managed Care)

## 2023-06-11 DIAGNOSIS — N1832 Chronic kidney disease, stage 3b: Secondary | ICD-10-CM | POA: Insufficient documentation

## 2023-06-11 DIAGNOSIS — E559 Vitamin D deficiency, unspecified: Secondary | ICD-10-CM | POA: Insufficient documentation

## 2023-06-11 LAB — BASIC METABOLIC PANEL
ANION GAP: 8 mmol/L (ref 4–13)
BUN/CREA RATIO: 12 (ref 6–22)
BUN: 24 mg/dL (ref 7–25)
CALCIUM: 9.6 mg/dL (ref 8.6–10.3)
CHLORIDE: 105 mmol/L (ref 98–107)
CO2 TOTAL: 26 mmol/L (ref 21–31)
CREATININE: 2.08 mg/dL — ABNORMAL HIGH (ref 0.60–1.30)
ESTIMATED GFR: 34 mL/min/{1.73_m2} — ABNORMAL LOW (ref >59)
GLUCOSE: 151 mg/dL — ABNORMAL HIGH (ref 74–109)
OSMOLALITY, CALCULATED: 285 mosm/kg (ref 270–290)
POTASSIUM: 4 mmol/L (ref 3.5–5.1)
SODIUM: 139 mmol/L (ref 136–145)

## 2023-06-11 LAB — CBC WITH DIFF
BASOPHIL #: 0 10*3/uL (ref 0.00–0.10)
BASOPHIL %: 1 % (ref 0–1)
EOSINOPHIL #: 0.1 10*3/uL (ref 0.00–0.50)
EOSINOPHIL %: 2 % (ref 1–8)
HCT: 39.6 % (ref 36.7–47.1)
HGB: 13 g/dL (ref 12.5–16.3)
LYMPHOCYTE #: 1.8 10*3/uL (ref 1.00–3.00)
LYMPHOCYTE %: 23 % (ref 16–44)
MCH: 29.1 pg (ref 23.8–33.4)
MCHC: 32.8 g/dL (ref 32.5–36.3)
MCV: 88.6 fL (ref 73.0–96.2)
MONOCYTE #: 0.7 10*3/uL (ref 0.30–1.00)
MONOCYTE %: 9 % (ref 5–13)
MPV: 10.7 fL (ref 7.4–11.4)
NEUTROPHIL #: 5.1 10*3/uL (ref 1.85–7.80)
NEUTROPHIL %: 65 % (ref 43–77)
PLATELETS: 156 10*3/uL (ref 140–440)
RBC: 4.47 10*6/uL (ref 4.06–5.63)
RDW: 14.5 % (ref 12.1–16.2)
WBC: 7.9 10*3/uL (ref 3.6–10.2)

## 2023-06-11 LAB — PARATHYROID HORMONE (PTH): PTH: 50.7 pg/mL (ref 12.0–88.0)

## 2023-06-11 LAB — MAGNESIUM: MAGNESIUM: 1.7 mg/dL — ABNORMAL LOW (ref 1.9–2.7)

## 2023-06-11 LAB — VITAMIN D 25 TOTAL: VITAMIN D 25, TOTAL: 51.57 ng/mL (ref 30.00–100.00)

## 2023-06-11 LAB — URIC ACID: URIC ACID: 10.7 mg/dL — ABNORMAL HIGH (ref 2.3–7.6)

## 2023-06-12 LAB — MICROALBUMIN/CREATININE RATIO, URINE, RANDOM
CREATININE RANDOM URINE: 71 mg/dL (ref 30–125)
MICROALBUMIN RANDOM URINE: 12.7 mg/dL
MICROALBUMIN/CREATININE RATIO RANDOM URINE: 178.9 mg/g

## 2023-06-12 LAB — PROTEIN/CREATININE RATIO, URINE, RANDOM
CREATININE RANDOM URINE: 71 mg/dL (ref 30–125)
PROTEIN RANDOM URINE: 18 mg/dL — ABNORMAL LOW (ref 50–80)
PROTEIN/CREATININE RATIO RANDOM URINE: 0.254 mg/mg (ref 0.000–200.000)

## 2023-06-18 ENCOUNTER — Encounter (INDEPENDENT_AMBULATORY_CARE_PROVIDER_SITE_OTHER): Payer: Self-pay

## 2023-06-18 ENCOUNTER — Ambulatory Visit (INDEPENDENT_AMBULATORY_CARE_PROVIDER_SITE_OTHER): Payer: Medicare (Managed Care)

## 2023-06-18 ENCOUNTER — Other Ambulatory Visit: Payer: Self-pay

## 2023-06-18 VITALS — BP 158/65 | HR 63 | Temp 97.3°F | Ht 70.0 in | Wt 219.1 lb

## 2023-06-18 DIAGNOSIS — I129 Hypertensive chronic kidney disease with stage 1 through stage 4 chronic kidney disease, or unspecified chronic kidney disease: Secondary | ICD-10-CM

## 2023-06-18 DIAGNOSIS — I10 Essential (primary) hypertension: Secondary | ICD-10-CM

## 2023-06-18 DIAGNOSIS — E559 Vitamin D deficiency, unspecified: Secondary | ICD-10-CM

## 2023-06-18 DIAGNOSIS — E790 Hyperuricemia without signs of inflammatory arthritis and tophaceous disease: Secondary | ICD-10-CM

## 2023-06-18 DIAGNOSIS — N1832 Chronic kidney disease, stage 3b (CMS HCC): Secondary | ICD-10-CM

## 2023-06-18 DIAGNOSIS — E1122 Type 2 diabetes mellitus with diabetic chronic kidney disease: Secondary | ICD-10-CM

## 2023-06-18 DIAGNOSIS — Z7984 Long term (current) use of oral hypoglycemic drugs: Secondary | ICD-10-CM

## 2023-06-18 DIAGNOSIS — Z794 Long term (current) use of insulin: Secondary | ICD-10-CM

## 2023-06-18 DIAGNOSIS — E119 Type 2 diabetes mellitus without complications: Secondary | ICD-10-CM

## 2023-06-18 MED ORDER — ALLOPURINOL 100 MG TABLET
100.0000 mg | ORAL_TABLET | Freq: Every day | ORAL | 2 refills | Status: DC
Start: 2023-06-18 — End: 2023-12-11

## 2023-06-18 NOTE — Progress Notes (Signed)
NEPHROLOGY, [facility]  [address] [place] [address]    Progress Note    Name: Matthew Sparks MRN:  [id number]   Date: 06/18/2023 DOB:  10-Jul-1955 (67 y.o.)              Nephrology Out  Patient Visit       HPI : 67 y.o.   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  Past medical history chronic kidney disease, hypertension, insulin-dependent diabetes mellitus type II, and hyperlipidemia. Denies complaints of edema. No edema noted today.  Currently taking [place]. Denies hematuria and dysuria.  Not currently taking NSAIDs.  Last hemoglobin A1c was 7.9.  States blood pressure has been under good control running 120s to 130s systolic.         ROS:      Assessment  was negative except what mentioned in in the HPI.       OBJECTIVE:   BP (!) 158/65   Pulse 63   Temp 36.3 C (97.3 F)   Ht 1.778 m (5\' 10" )   Wt 99.4 kg (219 lb 2.2 oz)   BMI 31.44 kg/m       General:  NAD, AAOx3  HEENT:  EOMI,  no icterus  NECK: No increased JVD.  Normal inspection   HEART:   Regular rhythm. No murmurs or rubs. No chest wall tenderness   LUNGS: Clear to auscultation bilateral. No wheezes, rales, or rhonchi.   ABDOMEN: +BS, Soft, nontender and nondistended. No rebound or guarding present.   EXTREMITIES: No edema. No cyanosis or clubbing.No asterixis    NEURO : Normal speech, EOMI.       LABORATORY DATA:   Lab Results   Component Value Date    BUN 24 06/11/2023    BUN 29 (H) 02/09/2023    BUN 34 (H) 10/10/2022    CREATININE 2.08 (H) 06/11/2023    CREATININE 1.89 (H) 02/09/2023    CREATININE 1.90 (H) 10/10/2022    BUNCRRATIO 12 06/11/2023    BUNCRRATIO 15 02/09/2023    BUNCRRATIO 18 10/10/2022    GFR 34 (L) 06/11/2023    GFR 38 (L) 02/09/2023    GFR 38 (L) 10/10/2022    SODIUM 139 06/11/2023    SODIUM 140 02/09/2023    SODIUM 140 10/10/2022    POTASSIUM 4.0 06/11/2023    POTASSIUM 4.0 02/09/2023    POTASSIUM 3.8 10/10/2022    CHLORIDE 105 06/11/2023    CHLORIDE 105 02/09/2023    CHLORIDE 103  10/10/2022    CO2 26 06/11/2023    CO2 28 02/09/2023    CO2 29 10/10/2022    ANIONGAP 8 06/11/2023    ANIONGAP 7 02/09/2023    ANIONGAP 8 10/10/2022    CALCIUM 9.6 06/11/2023    CALCIUM 9.8 02/09/2023    CALCIUM 9.3 10/10/2022    PHOSPHORUS 3.4 (L) 10/08/2022    PHOSPHORUS 3.8 03/27/2022    ALBUMIN 4.5 10/09/2022    ALBUMIN 4.8 10/08/2022    ALBUMIN 4.2 09/09/2022    HGB 13.0 06/11/2023    HGB 13.5 02/09/2023    HGB 12.9 10/09/2022    HCT 39.6 06/11/2023    HCT 41.8 02/09/2023    HCT 38.9 10/09/2022    INTACTPTH 50.7 06/11/2023    INTACTPTH 47.7 02/09/2023    INTACTPTH 47.4 10/08/2022    IRON 112 10/08/2022    IRONBINDCAP 424 10/08/2022    IRONSAT 26 10/08/2022  MEDICATIONS:  Outpatient Medications Marked as Taking for the 06/18/23 encounter (Office Visit) with Cydney Ok, NP   Medication Sig    acetaminophen (TYLENOL) 500 mg Oral Tablet Take 1 Tablet (500 mg total) by mouth Every 6 hours as needed for Pain    albuterol (PROVENTIL) 2.5 mg/0.5 mL Inhalation Solution for Nebulization Take 0.5 mL (2.5 mg total) by nebulization Three times a day    albuterol sulfate (PROVENTIL OR VENTOLIN OR PROAIR) 90 mcg/actuation Inhalation HFA Aerosol Inhaler Take 1-2 Puffs by inhalation Every 6 hours as needed    allopurinoL (ZYLOPRIM) 100 mg Oral Tablet Take 1 Tablet (100 mg total) by mouth Once a day for 270 days    amLODIPine (NORVASC) 10 mg Oral Tablet Take 1 Tablet (10 mg total) by mouth Once a day Indications: high blood pressure    aspirin 325 mg Oral Tablet Take 81 mg by mouth Once a day     atorvastatin (LIPITOR) 80 mg Oral Tablet Take 1 Tablet (80 mg total) by mouth Every evening    chlorTHALIDONE (HYGROTON) 50 mg Oral Tablet Take 1 Tablet (50 mg total) by mouth Once a day    clopidogreL (PLAVIX) 75 mg Oral Tablet TAKE 1 TABLET(75 MG) BY MOUTH EVERY DAY    dapagliflozin propanediol (FARXIGA) 10 mg Oral Tablet Take 1 Tablet (10 mg total) by mouth Once a day Indications: chronic kidney disease with  albuminuria    DEXCOM G7 SENSOR Does not apply Device REPLACE SENSOR EVERY 10 DAYS    ergocalciferol, vitamin D2, (DRISDOL) 1,250 mcg (50,000 unit) Oral Capsule Take 1 Capsule (50,000 Units total) by mouth Every 7 days    glimepiride (AMARYL) 4 mg Oral Tablet Take 0.5 Tablets (2 mg total) by mouth Twice daily    ipratropium-albuterol 0.5 mg-3 mg(2.5 mg base)/3 mL Solution for Nebulization 3 mL    isosorbide mononitrate (IMDUR) 30 mg Oral Tablet Sustained Release 24 hr Take 1 Tablet (30 mg total) by mouth Once a day    losartan (COZAAR) 25 mg Oral Tablet Take 1 Tablet (25 mg total) by mouth Once a day    metoprolol succinate (TOPROL-XL) 100 mg Oral Tablet Sustained Release 24 hr Take 1 Tablet (100 mg total) by mouth Once a day    multivitamin Oral Tablet Take 1 Tablet by mouth Once a day    nitroGLYCERIN (NITROSTAT) 0.3 mg Sublingual Tablet, Sublingual Place 1 Tablet (0.3 mg total) under the tongue Every 5 minutes as needed for Chest pain for 3 doses over 15 minutes    omega 3-dha-epa-fish oil 183.3 mg-75 mg -91.6 mg-306 mg Oral Capsule Take 1 Capsule by mouth Once a day    potassium chloride (K-DUR) 10 mEq Oral Tab Sust.Rel. Particle/Crystal Take 1 Tablet (10 mEq total) by mouth Once a day     Current Outpatient Medications   Medication Instructions    acetaminophen (TYLENOL) 500 mg, Oral, EVERY 6 HOURS PRN    albuterol (PROVENTIL) 2.5 mg, Nebulization, 3 TIMES DAILY    albuterol sulfate (PROVENTIL OR VENTOLIN OR PROAIR) 90 mcg/actuation Inhalation HFA Aerosol Inhaler 1-2 Puffs, Inhalation, EVERY 6 HOURS PRN    allopurinoL (ZYLOPRIM) 100 mg, Oral, DAILY    amLODIPine (NORVASC) 10 mg, Oral, DAILY    aspirin 81 mg, Oral, DAILY    atorvastatin (LIPITOR) 80 mg, Oral, EVERY EVENING    chlorTHALIDONE (HYGROTON) 50 mg, Oral, DAILY    clopidogreL (PLAVIX) 75 mg, Oral, DAILY    dapagliflozin propanediol (FARXIGA) 10 mg, Oral, DAILY  DEXCOM G7 SENSOR Does not apply Device REPLACE SENSOR EVERY 10 DAYS    ergocalciferol  (vitamin D2) (DRISDOL) 50,000 Units, Oral, EVERY 7 DAYS    glimepiride (AMARYL) 2 mg, Oral, 2 TIMES DAILY    HYDROcodone-acetaminophen (NORCO) 5-325 mg Oral Tablet 1 Tablet, Oral, EVERY 6 HOURS PRN    ipratropium-albuterol 0.5 mg-3 mg(2.5 mg base)/3 mL Solution for Nebulization 3 mL    isosorbide mononitrate (IMDUR) 30 mg, Oral, DAILY    LANTUS SOLOSTAR U-100 INSULIN 100 unit/mL (3 mL) Subcutaneous Insulin Pen     losartan (COZAAR) 25 mg, Oral, DAILY    metoprolol succinate (TOPROL-XL) 100 mg, Oral, DAILY    multivitamin Oral Tablet 1 Tablet, Oral, DAILY    nitroGLYCERIN (NITROSTAT) 0.3 mg, Sublingual, EVERY 5 MIN PRN, for 3 doses over 15 minutes    omega 3-dha-epa-fish oil 183.3 mg-75 mg -91.6 mg-306 mg Oral Capsule 1 Capsule, Oral, DAILY    potassium chloride (K-DUR) 10 mEq Oral Tab Sust.Rel. Particle/Crystal 10 mEq, Oral, DAILY       ASSESSMENT / PLAN:   ENCOUNTER DIAGNOSES     ICD-10-CM   1. 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 Hypertension, unspecified type  I10   3. DM (diabetes mellitus) (CMS HCC)  E11.9   4. Vitamin D deficiency  E55.9   5. 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    -Avoid NSAIDs.  Tylenol only for pain  -Renal imaging: No hydronephrosis no cysts or large masses  -ACEI or [facility]BS:  Losartan 25 mg daily  -Sodium Glucose Cotransporter-2 (SGLT2) Inhibitors: Farxiga 10 mg daily     CKD bone mineral disease  -PTH   PTH   Date Value Ref Range Status   06/11/2023 50.7 12.0 - 88.0 pg/mL Final              Hypertension  -Blood pressure is acceptable  -Goal less than 130/80  -Continue losartan 25 mg daily  -continue amlodipine 10 mg daily  -chlorthalidone 50 mg daily  -Low-salt diet      Diabetes  type 2  -A1c goal is less than 7   Lab Results   Component Value Date    HA1C 7.9 (H) 04/09/2023      -Continue Farxiga 10 mg daily  -continue sliding scale insulin  -Diabetic diet  -Increase activity and exercise as possible  -Monitor A1C        Hyperuricemia  -Check uric acid   Lab Results   Component Value Date    URICACID 10.7 (H) 06/11/2023       -Low uric acid diet      Vitamin D deficiency  -vitamin-D level acceptable   -continue to monitor vitamin-D level        I have discussed with the patient the following issues:  The main goal of treatment is to prevent progression of CKD to complete kidney failure. The best way to do this is to control the underlying cause.  the optimal diet is one similar to the Dietary Approaches to Stop Hypertension (DASH) diet, consisting of fruits, vegetables, legumes, fish,  poultry, and whole grains.  Restrict sodium intake to less than 2 g/day        Orders Placed This Encounter    BASIC METABOLIC PANEL    CBC/DIFF    MAGNESIUM    MICROALBUMIN/CREATININE RATIO, URINE, RANDOM    PARATHYROID HORMONE (PTH)    PROTEIN/CREATININE RATIO, URINE, RANDOM    URIC ACID    VITAMIN D 25 TOTAL    allopurinoL (ZYLOPRIM) 100 mg Oral Tablet       Cydney Ok, NP

## 2023-07-20 ENCOUNTER — Emergency Department
Admission: EM | Admit: 2023-07-20 | Discharge: 2023-07-20 | Disposition: A | Discharge status: Home / Self Care | Payer: Medicare Other | Attending: Family | Admitting: Family

## 2023-07-20 ENCOUNTER — Other Ambulatory Visit: Payer: Self-pay

## 2023-07-20 DIAGNOSIS — L853 Xerosis cutis: Secondary | ICD-10-CM | POA: Insufficient documentation

## 2023-07-20 MED ORDER — MUPIROCIN 2 % TOPICAL OINTMENT
TOPICAL_OINTMENT | Freq: Three times a day (TID) | CUTANEOUS | 0 refills | Status: AC
Start: 2023-07-20 — End: 2023-07-30

## 2023-07-20 NOTE — ED Nurses Note (Signed)
Pt evaluated and treated by medical provider while in waiting room area. No primary RN assigned; therefore, no assessment performed. All paperwork given and questions answered. Pt acknowledged all understanding. Leaving waiting area.

## 2023-07-20 NOTE — ED Triage Notes (Signed)
States skin peeling off his feet . States he pulled skin off thinks he pulled it to much

## 2023-07-20 NOTE — ED Provider Notes (Signed)
[facility] - Emergency Department  ED Primary Provider Note  History of Present Illness   Chief Complaint   Patient presents with    Skin Problem     Matthew Sparks is a 68 y.o. male who had concerns including Skin Problem.  Arrival: The patient arrived by Car    Patient is a 68 year old male to the emergency department with past medical history of type 2 diabetes complaining of dry skin to the bottom of his right foot.  Patient states it peeled off and he was told to come to the emergency department with any concerns.  Patient denies any erythema, pain, drainage from right foot.  Patient denies any recent antibiotic use.  Patient was placed on Novolin and states he believes that is a medication reaction.  Patient denies swelling, difficulty breathing or rash.      History Reviewed This Encounter:     Physical Exam   ED Triage Vitals [07/20/23 1053]   BP (Non-Invasive) (!) 169/74   Heart Rate 65   Respiratory Rate 20   Temperature 36.3 C (97.3 F)   SpO2 98 %   Weight 94.8 kg (209 lb)   Height 1.554 m (5' 1.2")     Physical Exam  Vitals and nursing note reviewed.   Constitutional:       General: He is not in acute distress.     Appearance: He is well-developed.   HENT:      Head: Normocephalic and atraumatic.   Eyes:      Conjunctiva/sclera: Conjunctivae normal.   Cardiovascular:      Rate and Rhythm: Normal rate and regular rhythm.      Heart sounds: No murmur heard.  Pulmonary:      Effort: Pulmonary effort is normal. No respiratory distress.      Breath sounds: Normal breath sounds.   Abdominal:      Palpations: Abdomen is soft.      Tenderness: There is no abdominal tenderness.   Musculoskeletal:         General: No swelling.      Cervical back: Neck supple.   Skin:     General: Skin is warm and dry.      Capillary Refill: Capillary refill takes less than 2 seconds.   Neurological:      Mental Status: He is alert.   Psychiatric:         Mood and Affect: Mood normal.       Patient Data   Labs  Ordered/Reviewed - No data to display  No orders to display     Medical Decision Making      Medical Decision Making  Patient is a 68 year old male to the emergency department with past medical history of type 2 diabetes complaining of dry skin to the bottom of his right foot.  Patient states it peeled off and he was told to come to the emergency department with any concerns.  Patient denies any erythema, pain, drainage from right foot.  Patient denies any recent antibiotic use.  Patient was placed on Novolin and states he believes that is a medication reaction.  Patient denies swelling, difficulty breathing or rash.    On physical examination patient has dry skin to the bottom of his right foot with no erythema, drainage or signs of infection.  Foot is warm and dry.  No rash noted.  Discussed with patient it is important to keep feet dry and clean especially with diabetes.  Patient given  mupirocin ointment to place to any areas of concern in otherwise follow up primary care provider as well as [facility] as he follows up for his diabetes with his foot.  No antibiotic necessary at this time as there is no signs of infection.    Risk  Prescription drug management.                   Clinical Impression   Dry skin (Primary)       Disposition: Discharged

## 2023-07-28 ENCOUNTER — Ambulatory Visit (HOSPITAL_COMMUNITY): Payer: Medicare Other

## 2023-07-28 ENCOUNTER — Encounter (HOSPITAL_COMMUNITY): Payer: Self-pay | Admitting: Surgery

## 2023-07-28 ENCOUNTER — Other Ambulatory Visit: Payer: Self-pay

## 2023-07-28 ENCOUNTER — Encounter (HOSPITAL_COMMUNITY)
Admission: RE | Disposition: A | Discharge status: Home / Self Care | Payer: Self-pay | Source: Ambulatory Visit | Attending: Surgery

## 2023-07-28 ENCOUNTER — Ambulatory Visit
Admission: RE | Admit: 2023-07-28 | Discharge: 2023-07-28 | Disposition: A | Discharge status: Home / Self Care | Payer: Medicare Other | Source: Ambulatory Visit | Attending: Surgery | Admitting: Surgery

## 2023-07-28 ENCOUNTER — Ambulatory Visit (HOSPITAL_COMMUNITY): Payer: Medicare Other | Admitting: Surgery

## 2023-07-28 DIAGNOSIS — K5730 Diverticulosis of large intestine without perforation or abscess without bleeding: Secondary | ICD-10-CM | POA: Insufficient documentation

## 2023-07-28 DIAGNOSIS — E1122 Type 2 diabetes mellitus with diabetic chronic kidney disease: Secondary | ICD-10-CM | POA: Insufficient documentation

## 2023-07-28 DIAGNOSIS — N183 Chronic kidney disease, stage 3 unspecified: Secondary | ICD-10-CM | POA: Insufficient documentation

## 2023-07-28 DIAGNOSIS — Q2733 Arteriovenous malformation of digestive system vessel: Secondary | ICD-10-CM

## 2023-07-28 DIAGNOSIS — I69354 Hemiplegia and hemiparesis following cerebral infarction affecting left non-dominant side: Secondary | ICD-10-CM | POA: Insufficient documentation

## 2023-07-28 DIAGNOSIS — K5520 Angiodysplasia of colon without hemorrhage: Secondary | ICD-10-CM | POA: Insufficient documentation

## 2023-07-28 DIAGNOSIS — Z794 Long term (current) use of insulin: Secondary | ICD-10-CM | POA: Insufficient documentation

## 2023-07-28 DIAGNOSIS — F17210 Nicotine dependence, cigarettes, uncomplicated: Secondary | ICD-10-CM | POA: Insufficient documentation

## 2023-07-28 DIAGNOSIS — J45909 Unspecified asthma, uncomplicated: Secondary | ICD-10-CM | POA: Insufficient documentation

## 2023-07-28 DIAGNOSIS — Z1211 Encounter for screening for malignant neoplasm of colon: Secondary | ICD-10-CM | POA: Insufficient documentation

## 2023-07-28 DIAGNOSIS — I129 Hypertensive chronic kidney disease with stage 1 through stage 4 chronic kidney disease, or unspecified chronic kidney disease: Secondary | ICD-10-CM | POA: Insufficient documentation

## 2023-07-28 DIAGNOSIS — F419 Anxiety disorder, unspecified: Secondary | ICD-10-CM | POA: Insufficient documentation

## 2023-07-28 DIAGNOSIS — Q438 Other specified congenital malformations of intestine: Secondary | ICD-10-CM | POA: Insufficient documentation

## 2023-07-28 DIAGNOSIS — K648 Other hemorrhoids: Secondary | ICD-10-CM | POA: Insufficient documentation

## 2023-07-28 SURGERY — COLONOSCOPY
Anesthesia: General | Wound class: Clean Contaminated Wounds-The respiratory, GI, Genital, or urinary

## 2023-07-28 MED ORDER — LACTATED RINGERS INTRAVENOUS SOLUTION
INTRAVENOUS | Status: DC | PRN
Start: 2023-07-28 — End: 2023-07-28
  Administered 2023-07-28: 0 via INTRAVENOUS

## 2023-07-28 MED ORDER — LIDOCAINE (PF) 100 MG/5 ML (2 %) INTRAVENOUS SYRINGE
INJECTION | Freq: Once | INTRAVENOUS | Status: DC | PRN
Start: 2023-07-28 — End: 2023-07-28
  Administered 2023-07-28: 100 mg via INTRAVENOUS

## 2023-07-28 MED ORDER — PROPOFOL 10 MG/ML IV BOLUS
INJECTION | Freq: Once | INTRAVENOUS | Status: DC | PRN
Start: 2023-07-28 — End: 2023-07-28
  Administered 2023-07-28: 20 mg via INTRAVENOUS
  Administered 2023-07-28 (×2): 40 mg via INTRAVENOUS
  Administered 2023-07-28 (×2): 20 mg via INTRAVENOUS
  Administered 2023-07-28: 40 mg via INTRAVENOUS
  Administered 2023-07-28: 20 mg via INTRAVENOUS

## 2023-07-28 MED ORDER — PROPOFOL 10 MG/ML INTRAVENOUS EMULSION
INTRAVENOUS | Status: DC | PRN
Start: 2023-07-28 — End: 2023-07-28
  Administered 2023-07-28: 0 ug/kg/min via INTRAVENOUS
  Administered 2023-07-28: 150 ug/kg/min via INTRAVENOUS

## 2023-07-28 SURGICAL SUPPLY — 1 items: DETERGENT INSTR 22OZ TRNSPT GEL RINSE FREE NEUT PH PREKLENZ CLR PLSNT LF (MISCELLANEOUS PT CARE ITEMS) ×1 IMPLANT
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
## 2023-07-28 NOTE — OR Surgeon (Signed)
OPERATIVE NOTE    Patient Name: Matthew Sparks, Matthew Sparks MRN:: [id number]  Date of Birth: [birth year]  Date of Service: 07/28/2023     Pre-Operative Diagnosis: Colon Screening     Post-Operative Diagnosis:   Internal hemorrhoids   Sigmoid diverticulosis   Redundant colon   Cecal AV malformations    Procedure(s)/Description:  COLONOSCOPY: 64403 (CPT)     Attending Surgeon: Clide Dales, MD     Anesthesia Staff:  CRNA: Levi Aland, CRNA    Anesthesia Type: .General     Estimated Blood Loss:  minimal    Specimens Removed: * No specimens in log *   * No orders in the log *     Complications:  None immediate    Indications for procedure:    [name], [name] is a 68 y.o. male who presents for  screening colonoscopy . Risks, benefits, indications, and complications of procedure were discussed in great detail and informed signed consent obtained.          Intraoperative Findings:     The Olympus colonoscope was brought to the operating field gently inserted into the patient's rectum and advanced to the level of the cecum under direct visualization without difficulty.  Once this anatomic landmark was reached the scope was slowly retracted with circumferential visualization of all colonic and rectal walls with findings of internal hemorrhoids, sigmoid diverticulosis, redundant colon, and limited AV malformations involving the cecum and right colon..  There was no evidence of colonic or rectal polyps, tumors or other AV malformations.   The colonic and rectal mucosa revealed no significant abnormalities where visualized excluding the limited telangiectasias of the cecum.  Retroflexion of the scope revealed the presence of internal hemorrhoids. The prep was overall adequate for diagnosstic exam however very small abnormalities may be missed given the nature of the exam.          Description of Procedure           The patient was brought to the Operating Suite and placed in the supine position on the operating table.   Anesthesia/nursing personnel provided IV access as well as hemodynamic monitoring.  After appropriate lines and leads were placed, the patient was placed in the left lateral decubitus position where they received total IV anesthesia.   After the patient was deemed comfortable, the Olympus colonoscope was brought to the operative field, gently inserted into the patient's rectum, and advanced to the level of the cecum under direct visualization with identification of the cecum through the use of normal anatomic landmarks.  Once the cecum was clearly identified, the scope was slowly retracted with circumferential visualization of all colonic and rectal walls with findings dictated above. In the rectum  the scope was retroflexed to evaluate the anorectal junction for the presence of hemorrhoids.  The scope was slowly straightened, the colon decompressed of as much air as possible and removed without difficulty.  The patient was returned to the post anesthesia care unit in stable condition having tolerated the procedure well.           Disposition:     Repeat colonoscopy in 10 years barring any change in symptoms    Clide Dales MD FACS RVT  [facility] -General Surgery

## 2023-07-28 NOTE — Nurses Notes (Signed)
1035 PER PATIENT'S DEXACOM BS 293 DR HOPKINS NOTIFIED ORDER GIVEN FOR 6 UNITS OF HUMALOG. PATIENT REFUSED THE HUMALOG INSULIN NOW  PATIENT STATES He WANTS TO EAT AND WILL TAKE His HUMALOG INSULIN AT HOME  DR HOPKINS NOTIFIED

## 2023-07-28 NOTE — Discharge Instructions (Addendum)
SURGICAL DISCHARGE INSTRUCTIONS     Dr. Abbe Amsterdam, Ladona Horns, MD  performed your COLONOSCOPY today at the [facility]    [place]  [facility]:  Monday through Friday from 8 a.m. - 4 p.m.: [phone]    For T&D: [phone]  Between 4 p.m. - 8 a.m., weekends and holidays:  Call ER [phone]    PLEASE SEE WRITTEN HANDOUTS AS DISCUSSED BY YOUR NURSE:  Harlynn Kimbell RN    SIGNS AND SYMPTOMS OF A WOUND / INCISION INFECTION   Be sure to watch for the following:  Increase in redness or red streaks near or around the wound or incision.  Increase in pain that is intense or severe and cannot be relieved by the pain medication that your doctor has given you.  Increase in swelling that cannot be relieved by elevation of a body part, or by applying ice, if permitted.  Increase in drainage, or if yellow / green in color and smells bad. This could be on a dressing or a cast.  Increase in fever for longer than 24 hours, or an increase that is higher than 101 degrees Fahrenheit (normal body temperature is 98 degrees Fahrenheit). The incision may feel warm to the touch.    **CALL YOUR DOCTOR IF ONE OR MORE OF THESE SIGNS / SYMPTOMS SHOULD OCCUR.    ANESTHESIA INFORMATION   ANESTHESIA -- ADULT PATIENTS:  You have received intravenous sedation / general anesthesia, and you may feel drowsy and light-headed for several hours. You may even experience some forgetfulness of the procedure. DO NOT DRIVE A MOTOR VEHICLE or perform any activity requiring complete alertness or coordination until you feel fully awake in about 24-48 hours. Do not drink alcoholic beverages for at least 24 hours. Do not stay alone, you must have a responsible adult available to be with you. You may also experience a dry mouth or nausea for 24 hours. This is a normal side effect and will disappear as the effects of the medication wear off.    REMEMBER   If you experience any difficulty breathing, chest pain, bleeding that you feel is  excessive, persistent nausea or vomiting or for any other concerns:  Call your physician Dr.  Clide Dales, MD   at [phone] . You may also ask to have the general doctor on call paged. They are available to you 24 hours a day.      SPECIAL INSTRUCTIONS / COMMENTS   POST-OP DIAGNOSIS--CECAL AVM'S, REDUNDANT COLON, DIVERTICULOSIS, HEMORRHOIDS  REST TODAY--DO NOT DRIVE OR OPERATE ELECTRICAL EQUIPMENT OR SIGN LEGAL DOCUMENTS FOR 24 HOURS  FOLLOW-UP WITH DR Abbe Amsterdam AS NEEDED      FOLLOW-UP APPOINTMENTS   Please call your surgeon's office at the number listed to schedule a date / time of return for follow-up.     Dr Whitney Muse [phone]

## 2023-07-28 NOTE — Anesthesia Preprocedure Evaluation (Signed)
ANESTHESIA PRE-OP EVALUATION  Planned Procedure: COLONOSCOPY  Review of Systems     anesthesia history negative     patient summary reviewed  nursing notes reviewed        Pulmonary   asthma,   Cardiovascular    Hypertension, well controlled and ECG reviewed , Exercise Tolerance: > or = 4 METS        GI/Hepatic/Renal    renal insufficiency (Stage 3 CKD)        Endo/Other      type 2 diabetes/ stable/ controlled with insulin    Neuro/Psych/MS    TIA, CVA (weak on left side), residual symptoms, back abnormality, anxiety    peripheral neuropathy,  Cancer    negative hematology/oncology ROS,               Physical Assessment      Airway       Mallampati: III    TM distance: >3 FB    Neck ROM: full  Mouth Opening: good.      No endotracheal tube present      Dental           (+) upper dentures, lower dentures           Pulmonary           Cardiovascular             Other findings          Plan  ASA 3     Planned anesthesia type: general     total intravenous anesthesia                    Intravenous induction     Anesthesia issues/risks discussed are: Nerve Injuries, Eye /Visual Loss, Dental Injuries, PONV, Post-op Intubation/Ventilation, Post-op Agitation/Tantrum, Cardiac Events/MI, Post-op Pain Management, Post-op Cognitive Dysfunction, Stroke, Intraoperative Awareness/ Recall, Aspiration, Blood Loss, Difficult Airway and Sore Throat.  Anesthetic plan and risks discussed with patient  signed consent obtained      Use of blood products discussed with patient who consented to blood products.      Patient's NPO status is appropriate for Anesthesia.

## 2023-07-28 NOTE — Anesthesia Postprocedure Evaluation (Signed)
Anesthesia Post Op Evaluation    Patient: Matthew Sparks  Procedure(s):  COLONOSCOPY    Last Vitals:Temperature: 36.3 C (97.4 F) (07/28/23 1022)  Heart Rate: 72 (07/28/23 1022)  BP (Non-Invasive): (!) 116/46 (07/28/23 1022)  Respiratory Rate: 18 (07/28/23 1022)  SpO2: 94 % (07/28/23 1022)    No notable events documented.    Patient is sufficiently recovered from the effects of anesthesia to participate in the evaluation and has returned to their pre-procedure level.  Patient location during evaluation: PACU       Patient participation: complete - patient participated  Level of consciousness: awake and alert and responsive to verbal stimuli    Pain score: 0  Pain management: adequate  Airway patency: patent    Anesthetic complications: no  Cardiovascular status: acceptable  Respiratory status: acceptable  Hydration status: acceptable  Patient post-procedure temperature: Pt Normothermic   PONV Status: Absent

## 2023-07-29 ENCOUNTER — Telehealth (INDEPENDENT_AMBULATORY_CARE_PROVIDER_SITE_OTHER): Payer: Self-pay | Admitting: Surgery

## 2023-08-05 ENCOUNTER — Other Ambulatory Visit: Payer: Self-pay

## 2023-08-05 ENCOUNTER — Ambulatory Visit: Payer: MEDICAID | Attending: INTERNAL MEDICINE-ENDOCRINOLOGY-DIABETES AND METABOLISM

## 2023-08-05 DIAGNOSIS — E559 Vitamin D deficiency, unspecified: Secondary | ICD-10-CM | POA: Insufficient documentation

## 2023-08-05 DIAGNOSIS — N1832 Chronic kidney disease, stage 3b: Secondary | ICD-10-CM | POA: Insufficient documentation

## 2023-08-05 DIAGNOSIS — R5383 Other fatigue: Secondary | ICD-10-CM | POA: Insufficient documentation

## 2023-08-05 LAB — CBC WITH DIFF
BASOPHIL #: 0.1 10*3/uL (ref 0.00–0.10)
BASOPHIL %: 1 % (ref 0–1)
EOSINOPHIL #: 0.2 10*3/uL (ref 0.00–0.50)
EOSINOPHIL %: 3 % (ref 1–8)
HCT: 44 % (ref 36.7–47.1)
HGB: 14.1 g/dL (ref 12.5–16.3)
LYMPHOCYTE #: 2.2 10*3/uL (ref 1.00–3.20)
LYMPHOCYTE %: 30 % (ref 15–43)
MCH: 28.5 pg (ref 23.8–33.4)
MCHC: 32.1 g/dL — ABNORMAL LOW (ref 32.5–36.3)
MCV: 88.8 fL (ref 73.0–96.2)
MONOCYTE #: 0.8 10*3/uL (ref 0.30–1.10)
MONOCYTE %: 10 % (ref 6–14)
MPV: 10.9 fL (ref 7.4–11.4)
NEUTROPHIL #: 4.2 10*3/uL (ref 1.70–7.60)
NEUTROPHIL %: 56 % (ref 44–74)
PLATELETS: 180 10*3/uL (ref 140–440)
RBC: 4.96 10*6/uL (ref 4.06–5.63)
RDW: 14.5 % (ref 12.1–16.2)
WBC: 7.5 10*3/uL (ref 3.6–10.2)

## 2023-08-05 LAB — VITAMIN D 25 TOTAL: VITAMIN D 25, TOTAL: 49.29 ng/mL (ref 30.00–100.00)

## 2023-08-05 LAB — BASIC METABOLIC PANEL
ANION GAP: 12 mmol/L (ref 4–13)
BUN/CREA RATIO: 17 (ref 6–22)
BUN: 30 mg/dL — ABNORMAL HIGH (ref 7–25)
CALCIUM: 9.8 mg/dL (ref 8.6–10.3)
CHLORIDE: 107 mmol/L (ref 98–107)
CO2 TOTAL: 26 mmol/L (ref 21–31)
CREATININE: 1.73 mg/dL — ABNORMAL HIGH (ref 0.60–1.30)
ESTIMATED GFR: 42 mL/min/{1.73_m2} — ABNORMAL LOW (ref >59)
GLUCOSE: 63 mg/dL — ABNORMAL LOW (ref 74–109)
OSMOLALITY, CALCULATED: 293 mosm/kg — ABNORMAL HIGH (ref 270–290)
POTASSIUM: 3.6 mmol/L (ref 3.5–5.1)
SODIUM: 145 mmol/L (ref 136–145)

## 2023-08-05 LAB — PROTEIN/CREATININE RATIO, URINE, RANDOM
CREATININE RANDOM URINE: 143 mg/dL — ABNORMAL HIGH (ref 30–125)
PROTEIN RANDOM URINE: 85 mg/dL — ABNORMAL HIGH (ref 50–80)
PROTEIN/CREATININE RATIO RANDOM URINE: 0.594 mg/mg (ref 0.000–200.000)

## 2023-08-05 LAB — MICROALBUMIN/CREATININE RATIO, URINE, RANDOM
CREATININE RANDOM URINE: 143 mg/dL — ABNORMAL HIGH (ref 30–125)
MICROALBUMIN RANDOM URINE: 57 mg/dL
MICROALBUMIN/CREATININE RATIO RANDOM URINE: 398.6 mg/g

## 2023-08-05 LAB — HGA1C (HEMOGLOBIN A1C WITH EST AVG GLUCOSE): HEMOGLOBIN A1C: 7.7 % — ABNORMAL HIGH (ref 4.0–6.0)

## 2023-08-05 LAB — MAGNESIUM: MAGNESIUM: 1.8 mg/dL — ABNORMAL LOW (ref 1.9–2.7)

## 2023-08-05 LAB — PARATHYROID HORMONE (PTH): PTH: 57.1 pg/mL (ref 12.0–88.0)

## 2023-08-05 LAB — URIC ACID: URIC ACID: 8.9 mg/dL — ABNORMAL HIGH (ref 2.3–7.6)

## 2023-09-09 ENCOUNTER — Other Ambulatory Visit (INDEPENDENT_AMBULATORY_CARE_PROVIDER_SITE_OTHER): Payer: Self-pay

## 2023-09-10 ENCOUNTER — Other Ambulatory Visit (INDEPENDENT_AMBULATORY_CARE_PROVIDER_SITE_OTHER): Payer: Self-pay

## 2023-09-10 ENCOUNTER — Ambulatory Visit

## 2023-09-10 ENCOUNTER — Other Ambulatory Visit: Payer: Self-pay

## 2023-09-10 DIAGNOSIS — E559 Vitamin D deficiency, unspecified: Secondary | ICD-10-CM

## 2023-09-10 DIAGNOSIS — N1832 Chronic kidney disease, stage 3b: Secondary | ICD-10-CM | POA: Insufficient documentation

## 2023-09-10 LAB — BASIC METABOLIC PANEL
ANION GAP: 12 mmol/L (ref 4–13)
BUN/CREA RATIO: 15 (ref 6–22)
BUN: 25 mg/dL (ref 7–25)
CALCIUM: 10.1 mg/dL (ref 8.6–10.3)
CHLORIDE: 104 mmol/L (ref 98–107)
CO2 TOTAL: 27 mmol/L (ref 21–31)
CREATININE: 1.72 mg/dL — ABNORMAL HIGH (ref 0.60–1.30)
ESTIMATED GFR: 42 mL/min/{1.73_m2} — ABNORMAL LOW (ref >59)
GLUCOSE: 114 mg/dL — ABNORMAL HIGH (ref 74–109)
OSMOLALITY, CALCULATED: 290 mosm/kg (ref 270–290)
POTASSIUM: 3.6 mmol/L (ref 3.5–5.1)
SODIUM: 143 mmol/L (ref 136–145)

## 2023-09-10 LAB — CBC WITH DIFF
BASOPHIL #: 0.1 10*3/uL (ref 0.00–0.10)
BASOPHIL %: 1 % (ref 0–1)
EOSINOPHIL #: 0.2 10*3/uL (ref 0.00–0.50)
EOSINOPHIL %: 3 % (ref 1–8)
HCT: 43.6 % (ref 36.7–47.1)
HGB: 14.2 g/dL (ref 12.5–16.3)
LYMPHOCYTE #: 2.1 10*3/uL (ref 1.00–3.20)
LYMPHOCYTE %: 26 % (ref 15–43)
MCH: 28.5 pg (ref 23.8–33.4)
MCHC: 32.6 g/dL (ref 32.5–36.3)
MCV: 87.4 fL (ref 73.0–96.2)
MONOCYTE #: 0.7 10*3/uL (ref 0.30–1.10)
MONOCYTE %: 9 % (ref 6–14)
MPV: 10.5 fL (ref 7.4–11.4)
NEUTROPHIL #: 4.9 10*3/uL (ref 1.70–7.60)
NEUTROPHIL %: 61 % (ref 44–74)
PLATELETS: 178 10*3/uL (ref 140–440)
RBC: 4.99 10*6/uL (ref 4.06–5.63)
RDW: 14.8 % (ref 12.1–16.2)
WBC: 8 10*3/uL (ref 3.6–10.2)

## 2023-09-10 LAB — MAGNESIUM: MAGNESIUM: 1.7 mg/dL — ABNORMAL LOW (ref 1.9–2.7)

## 2023-09-10 LAB — MICROALBUMIN/CREATININE RATIO, URINE, RANDOM
CREATININE RANDOM URINE: 196 mg/dL — ABNORMAL HIGH (ref 30–125)
MICROALBUMIN RANDOM URINE: 160.3 mg/dL
MICROALBUMIN/CREATININE RATIO RANDOM URINE: 817.9 mg/g

## 2023-09-10 LAB — URIC ACID: URIC ACID: 10.5 mg/dL — ABNORMAL HIGH (ref 2.3–7.6)

## 2023-09-10 LAB — VITAMIN D 25 TOTAL: VITAMIN D 25, TOTAL: 45.61 ng/mL (ref 30.00–100.00)

## 2023-09-10 LAB — PROTEIN/CREATININE RATIO, URINE, RANDOM
CREATININE RANDOM URINE: 196 mg/dL — ABNORMAL HIGH (ref 30–125)
PROTEIN RANDOM URINE: 213 mg/dL — ABNORMAL HIGH (ref 50–80)
PROTEIN/CREATININE RATIO RANDOM URINE: 1.087 mg/mg (ref 0.000–200.000)

## 2023-09-10 LAB — PARATHYROID HORMONE (PTH): PTH: 31 pg/mL (ref 12.0–88.0)

## 2023-09-16 ENCOUNTER — Encounter (INDEPENDENT_AMBULATORY_CARE_PROVIDER_SITE_OTHER): Payer: Self-pay

## 2023-09-16 ENCOUNTER — Ambulatory Visit (INDEPENDENT_AMBULATORY_CARE_PROVIDER_SITE_OTHER): Payer: Self-pay

## 2023-09-16 ENCOUNTER — Other Ambulatory Visit: Payer: Self-pay

## 2023-09-16 VITALS — BP 127/84 | HR 63 | Ht 70.0 in | Wt 222.0 lb

## 2023-09-16 DIAGNOSIS — Z794 Long term (current) use of insulin: Secondary | ICD-10-CM

## 2023-09-16 DIAGNOSIS — I129 Hypertensive chronic kidney disease with stage 1 through stage 4 chronic kidney disease, or unspecified chronic kidney disease: Secondary | ICD-10-CM

## 2023-09-16 DIAGNOSIS — I10 Essential (primary) hypertension: Secondary | ICD-10-CM

## 2023-09-16 DIAGNOSIS — E559 Vitamin D deficiency, unspecified: Secondary | ICD-10-CM

## 2023-09-16 DIAGNOSIS — Z7984 Long term (current) use of oral hypoglycemic drugs: Secondary | ICD-10-CM

## 2023-09-16 DIAGNOSIS — E790 Hyperuricemia without signs of inflammatory arthritis and tophaceous disease: Secondary | ICD-10-CM

## 2023-09-16 DIAGNOSIS — N183 Chronic kidney disease, stage 3 unspecified: Secondary | ICD-10-CM

## 2023-09-16 DIAGNOSIS — E119 Type 2 diabetes mellitus without complications: Secondary | ICD-10-CM

## 2023-09-16 DIAGNOSIS — E1122 Type 2 diabetes mellitus with diabetic chronic kidney disease: Secondary | ICD-10-CM

## 2023-09-16 DIAGNOSIS — R809 Proteinuria, unspecified: Secondary | ICD-10-CM

## 2023-09-16 DIAGNOSIS — N1832 Chronic kidney disease, stage 3b: Secondary | ICD-10-CM

## 2023-09-16 NOTE — Progress Notes (Signed)
 NEPHROLOGY, [facility]  [address] [place] [address]    Progress Note    Name: Matthew Sparks MRN:  [id number]   Date: 09/16/2023 DOB:  Apr 10, 1956 (68 y.o.)              Nephrology Out  Patient Visit       HPI : 68 y.o.   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  Past medical history chronic kidney disease, hypertension, insulin-dependent diabetes mellitus type II, and hyperlipidemia.   Currently taking Farxiga . Denies hematuria and dysuria.  Currently has a Dexcom continuous blood sugar monitoring.  States blood sugar has been in good control.  Hemoglobin A1c was less than 7.  Blood pressure has been good control.  States blood pressure averages 120s to 130's.  He reports no problems with edema.  Is no edema noted today.        ROS:      Assessment  was negative except what mentioned in in the HPI.       OBJECTIVE:   There were no vitals taken for this visit.      General:  NAD, AAOx3  HEENT:  EOMI,  no icterus  NECK: No increased JVD.  Normal inspection   HEART:   Regular rhythm. No murmurs or rubs. No chest wall tenderness   LUNGS: Clear to auscultation bilateral. No wheezes, rales, or rhonchi.   ABDOMEN: +BS, Soft, nontender and nondistended. No rebound or guarding present.   EXTREMITIES: No edema. No cyanosis or clubbing.No asterixis    NEURO : Normal speech, EOMI.       LABORATORY DATA:   Lab Results   Component Value Date    BUN 25 09/10/2023    BUN 30 (H) 08/05/2023    BUN 24 06/11/2023    CREATININE 1.72 (H) 09/10/2023    CREATININE 1.73 (H) 08/05/2023    CREATININE 2.08 (H) 06/11/2023    BUNCRRATIO 15 09/10/2023    BUNCRRATIO 17 08/05/2023    BUNCRRATIO 12 06/11/2023    GFR 42 (L) 09/10/2023    GFR 42 (L) 08/05/2023    GFR 34 (L) 06/11/2023    SODIUM 143 09/10/2023    SODIUM 145 08/05/2023    SODIUM 139 06/11/2023    POTASSIUM 3.6 09/10/2023    POTASSIUM 3.6 08/05/2023    POTASSIUM 4.0 06/11/2023    CHLORIDE 104 09/10/2023    CHLORIDE 107 08/05/2023     CHLORIDE 105 06/11/2023    CO2 27 09/10/2023    CO2 26 08/05/2023    CO2 26 06/11/2023    ANIONGAP 12 09/10/2023    ANIONGAP 12 08/05/2023    ANIONGAP 8 06/11/2023    CALCIUM 10.1 09/10/2023    CALCIUM 9.8 08/05/2023    CALCIUM 9.6 06/11/2023    PHOSPHORUS 3.4 (L) 10/08/2022    PHOSPHORUS 3.8 03/27/2022    ALBUMIN 4.5 10/09/2022    ALBUMIN 4.8 10/08/2022    ALBUMIN 4.2 09/09/2022    HGB 14.2 09/10/2023    HGB 14.1 08/05/2023    HGB 13.0 06/11/2023    HCT 43.6 09/10/2023    HCT 44.0 08/05/2023    HCT 39.6 06/11/2023    INTACTPTH 31.0 09/10/2023    INTACTPTH 57.1 08/05/2023    INTACTPTH 50.7 06/11/2023    IRON 112 10/08/2022    IRONBINDCAP 424 10/08/2022    IRONSAT 26 10/08/2022           MEDICATIONS:  No  outpatient medications have been marked as taking for the 09/16/23 encounter (Appointment) with [facility], NP.     Current Outpatient Medications   Medication Instructions    acetaminophen  (TYLENOL ) 500 mg, Oral, EVERY 6 HOURS PRN    albuterol (PROVENTIL) 2.5 mg, Nebulization, 3 TIMES DAILY    albuterol sulfate (PROVENTIL OR VENTOLIN OR PROAIR) 90 mcg/actuation Inhalation HFA Aerosol Inhaler 1-2 Puffs, Inhalation, EVERY 6 HOURS PRN    allopurinoL  (ZYLOPRIM ) 100 mg, Oral, Daily    amLODIPine  (NORVASC) 10 mg, Oral, Daily    aspirin 81 mg, Oral, Daily    atorvastatin  (LIPITOR) 80 mg, Oral, EVERY EVENING    chlorTHALIDONE (HYGROTON) 50 mg, Oral, Daily    clopidogreL  (PLAVIX ) 75 mg, Oral, Daily    dapagliflozin  propanediol (FARXIGA ) 10 mg, Oral, DAILY    DEXCOM G7 SENSOR Does not apply Device REPLACE SENSOR EVERY 10 DAYS    ergocalciferol  (vitamin D2) (DRISDOL ) 50,000 Units, Oral, EVERY 7 DAYS    glimepiride (AMARYL) 2 mg, Oral, 2 TIMES DAILY    HYDROcodone -acetaminophen  (NORCO) 5-325 mg Oral Tablet 1 Tablet, Oral, EVERY 6 HOURS PRN    ipratropium-albuterol 0.5 mg-3 mg(2.5 mg base)/3 mL Solution for Nebulization 3 mL    isosorbide mononitrate (IMDUR) 30 mg, Oral, Daily    LANTUS SOLOSTAR U-100 INSULIN 100 unit/mL  (3 mL) Subcutaneous Insulin Pen     losartan (COZAAR) 25 mg, Oral, Daily    metoprolol  succinate (TOPROL -XL) 100 mg, Oral, Daily    metoprolol  succinate (TOPROL -XL) 50 mg, Oral, Daily    multivitamin Oral Tablet 1 Tablet, Oral, Daily    nitroGLYCERIN (NITROSTAT) 0.3 mg, Sublingual, EVERY 5 MIN PRN, for 3 doses over 15 minutes    omega 3-dha-epa-fish oil 183.3 mg-75 mg -91.6 mg-306 mg Oral Capsule 1 Capsule, Oral, Daily    potassium chloride  (K-DUR) 10 mEq Oral Tab Sust.Rel. Particle/Crystal 10 mEq, Oral, Daily       ASSESSMENT / PLAN:   ENCOUNTER DIAGNOSES     ICD-10-CM   1. CKD (chronic kidney disease) stage 3, GFR 30-59 ml/min (CMS HCC)  N18.30   2. Hypertension, unspecified type  I10   3. DM (diabetes mellitus) (CMS HCC)  E11.9   4. Vitamin D  deficiency  E55.9              Chronic kidney disease  -Stage IIIb  -Due to diabetes and hypertension  -Creatinine is 2.08 /GFR 34  -Baseline creatinine :  1.92  -Total protein to creatinine ratio: 1.09  -CBC and a basic metabolic panel  -Return to clinic in 3 months  -Continue low-sodium diet  -balanced Fluid intake 50  oz a day.    -Avoid NSAIDs.  Tylenol  only for pain  -Renal imaging: No hydronephrosis no cysts or large masses  -ACEI or ARBS:  Losartan 25 mg daily  -Sodium Glucose Cotransporter-2 (SGLT2) Inhibitors: Farxiga  10 mg daily           Proteinuria  -serology          Hypertension  -Blood pressure is acceptable  -Goal less than 130/80  -Continue losartan 25 mg daily  -continue amlodipine  10 mg daily  -chlorthalidone 50 mg daily  -Low-salt diet        Diabetes type 2  -A1c goal is less than 7   Lab Results   Component Value Date    HA1C 7.7 (H) 08/05/2023      -Continue Farxiga  10 mg daily  -continue sliding scale insulin  -Diabetic diet  -Increase  activity and exercise as possible  -Monitor A1C        Hyperuricemia  -Check uric acid   Lab Results   Component Value Date    URICACID 10.5 (H) 09/10/2023       -Low uric acid diet  -allopurinol  100 mg daily          Vitamin D  deficiency  -vitamin-D level acceptable   -continue to monitor vitamin-D level        I have discussed with the patient the following issues:  The main goal of treatment is to prevent progression of CKD to complete kidney failure. The best way to do this is to control the underlying cause.  the optimal diet is one similar to the Dietary Approaches to Stop Hypertension (DASH) diet, consisting of fruits, vegetables, legumes, fish, poultry, and whole grains.  Restrict sodium intake to less than 2 g/day        No orders of the defined types were placed in this encounter.      [facility], NP

## 2023-10-15 ENCOUNTER — Other Ambulatory Visit: Payer: Self-pay

## 2023-10-15 ENCOUNTER — Emergency Department
Admission: EM | Admit: 2023-10-15 | Discharge: 2023-10-15 | Disposition: A | Discharge status: Home / Self Care | Attending: Emergency Medicine | Admitting: Emergency Medicine

## 2023-10-15 DIAGNOSIS — R7309 Other abnormal glucose: Secondary | ICD-10-CM

## 2023-10-15 DIAGNOSIS — Z794 Long term (current) use of insulin: Secondary | ICD-10-CM | POA: Insufficient documentation

## 2023-10-15 DIAGNOSIS — E119 Type 2 diabetes mellitus without complications: Secondary | ICD-10-CM | POA: Insufficient documentation

## 2023-10-15 LAB — POC BLOOD GLUCOSE (RESULTS): GLUCOSE, POC: 137 mg/dL — ABNORMAL HIGH (ref 70–100)
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
## 2023-10-15 NOTE — ED Notes (Signed)
 [facility] - Emergency Department  Peer Recovery Coach Assessment    Initial Evaluation                         Substance Use History  Patient current substance use status: FALSE POSITIVE; Chief Complaint Med clearance; not drug or alcohol related. Patient not seen by PRSS.                                  Family, Social, Home & Safety History                                                 Employment            Engagement  Readiness ruler: 1    Brief Intervention  Discussed plan to reduce/quit substance use?: No  Discussed willingness to enter treatment?: No  Indicated patient's stage of change:: 1 - Precontemplation    Patient seen by Peer Recovery Coach and is a candidate for buprenorphine administration in the ED. Patient needs assessment for bup treatment.: No    Plan  Was the patient referred to treatment?: No    Was patient referred to physician for Buprenorphine Assessment in the ED?: No    Did patient receive Narcan in the ED?: No         Follow-up           Need for additional follow-up?: No       Bess Broody, Peer Recovery Coach 10/15/2023 12:45

## 2023-10-15 NOTE — ED Nurses Note (Signed)
 A full assessment was not completed by this nurse. The patient was seen and discharged from the waiting area. The patient left the ed prior to discharge instructions

## 2023-10-15 NOTE — ED Triage Notes (Signed)
 Pt reports changed glucose censor this AM and it is reading low blood glucose. BS reading on FS here was 137.

## 2023-10-15 NOTE — Discharge Instructions (Signed)
 Please follow-up with your primary care    Please return to the ER for anything that concerns you

## 2023-10-22 ENCOUNTER — Other Ambulatory Visit (INDEPENDENT_AMBULATORY_CARE_PROVIDER_SITE_OTHER): Payer: Self-pay

## 2023-10-22 ENCOUNTER — Other Ambulatory Visit (INDEPENDENT_AMBULATORY_CARE_PROVIDER_SITE_OTHER): Payer: Self-pay | Admitting: Nephrology

## 2023-10-22 MED ORDER — DAPAGLIFLOZIN PROPANEDIOL 10 MG TABLET
10.0000 mg | ORAL_TABLET | Freq: Every day | ORAL | 3 refills | Status: DC
Start: 2023-10-22 — End: 2023-11-17

## 2023-10-22 NOTE — Telephone Encounter (Signed)
Wrong Pharmacy

## 2023-11-03 ENCOUNTER — Ambulatory Visit: Attending: INTERNAL MEDICINE-ENDOCRINOLOGY-DIABETES AND METABOLISM

## 2023-11-03 ENCOUNTER — Other Ambulatory Visit: Payer: Self-pay

## 2023-11-03 DIAGNOSIS — R5383 Other fatigue: Secondary | ICD-10-CM | POA: Insufficient documentation

## 2023-11-03 DIAGNOSIS — E119 Type 2 diabetes mellitus without complications: Secondary | ICD-10-CM | POA: Insufficient documentation

## 2023-11-03 LAB — HGA1C (HEMOGLOBIN A1C WITH EST AVG GLUCOSE): HEMOGLOBIN A1C: 7.6 % — ABNORMAL HIGH (ref 4.0–6.0)

## 2023-11-03 LAB — MICROALBUMIN URINE, RANDOM: MICROALBUMIN RANDOM URINE: 25.2 mg/dL

## 2023-11-04 LAB — C-PEPTIDE: C-PEPTIDE: 8.3 ng/mL — ABNORMAL HIGH (ref 1.4–6.8)

## 2023-11-17 ENCOUNTER — Other Ambulatory Visit (INDEPENDENT_AMBULATORY_CARE_PROVIDER_SITE_OTHER): Payer: Self-pay

## 2023-11-17 ENCOUNTER — Other Ambulatory Visit (INDEPENDENT_AMBULATORY_CARE_PROVIDER_SITE_OTHER): Payer: Self-pay | Admitting: Nephrology

## 2023-11-17 MED ORDER — DAPAGLIFLOZIN PROPANEDIOL 10 MG TABLET
10.0000 mg | ORAL_TABLET | Freq: Every day | ORAL | 3 refills | Status: DC
Start: 2023-11-17 — End: 2024-03-18

## 2023-11-17 NOTE — Telephone Encounter (Signed)
 Sent in another encounter

## 2023-12-08 ENCOUNTER — Other Ambulatory Visit (INDEPENDENT_AMBULATORY_CARE_PROVIDER_SITE_OTHER): Payer: Self-pay

## 2023-12-08 DIAGNOSIS — E790 Hyperuricemia without signs of inflammatory arthritis and tophaceous disease: Secondary | ICD-10-CM

## 2023-12-10 ENCOUNTER — Other Ambulatory Visit (INDEPENDENT_AMBULATORY_CARE_PROVIDER_SITE_OTHER): Payer: Self-pay

## 2023-12-10 ENCOUNTER — Ambulatory Visit (INDEPENDENT_AMBULATORY_CARE_PROVIDER_SITE_OTHER): Payer: Self-pay

## 2023-12-10 ENCOUNTER — Other Ambulatory Visit: Payer: Self-pay

## 2023-12-10 ENCOUNTER — Other Ambulatory Visit

## 2023-12-10 DIAGNOSIS — E790 Hyperuricemia without signs of inflammatory arthritis and tophaceous disease: Secondary | ICD-10-CM

## 2023-12-10 DIAGNOSIS — E559 Vitamin D deficiency, unspecified: Secondary | ICD-10-CM | POA: Insufficient documentation

## 2023-12-10 DIAGNOSIS — N183 Chronic kidney disease, stage 3 unspecified: Secondary | ICD-10-CM

## 2023-12-10 DIAGNOSIS — R809 Proteinuria, unspecified: Secondary | ICD-10-CM

## 2023-12-10 LAB — BASIC METABOLIC PANEL
ANION GAP: 9 mmol/L (ref 4–13)
BUN/CREA RATIO: 18 (ref 6–22)
BUN: 46 mg/dL — ABNORMAL HIGH (ref 7–25)
CALCIUM: 9.6 mg/dL (ref 8.6–10.3)
CHLORIDE: 108 mmol/L — ABNORMAL HIGH (ref 98–107)
CO2 TOTAL: 23 mmol/L (ref 21–31)
CREATININE: 2.59 mg/dL — ABNORMAL HIGH (ref 0.60–1.30)
ESTIMATED GFR: 26 mL/min/{1.73_m2} — ABNORMAL LOW (ref >59)
GLUCOSE: 134 mg/dL — ABNORMAL HIGH (ref 74–109)
OSMOLALITY, CALCULATED: 293 mosm/kg — ABNORMAL HIGH (ref 270–290)
POTASSIUM: 4.1 mmol/L (ref 3.5–5.1)
SODIUM: 140 mmol/L (ref 136–145)

## 2023-12-10 LAB — CBC WITH DIFF
BASOPHIL #: 0 10*3/uL (ref 0.00–0.10)
BASOPHIL %: 1 % (ref 0–1)
EOSINOPHIL #: 0.3 10*3/uL (ref 0.00–0.60)
EOSINOPHIL %: 4 % (ref 1–8)
HCT: 38.5 % (ref 36.7–47.1)
HGB: 12.8 g/dL (ref 12.5–16.3)
LYMPHOCYTE #: 2 10*3/uL (ref 1.00–3.00)
LYMPHOCYTE %: 29 % (ref 15–43)
MCH: 28.7 pg (ref 23.8–33.4)
MCHC: 33.2 g/dL (ref 32.5–36.3)
MCV: 86.6 fL (ref 73.0–96.2)
MONOCYTE #: 0.6 10*3/uL (ref 0.30–1.10)
MONOCYTE %: 9 % (ref 6–14)
MPV: 10.9 fL (ref 7.4–11.4)
NEUTROPHIL #: 4 10*3/uL (ref 1.85–7.84)
NEUTROPHIL %: 58 % (ref 44–74)
PLATELETS: 188 10*3/uL (ref 140–440)
RBC: 4.44 10*6/uL (ref 4.06–5.63)
RDW: 14.7 % (ref 12.1–16.2)
WBC: 6.9 10*3/uL (ref 3.6–10.2)

## 2023-12-10 LAB — URIC ACID: URIC ACID: 8.1 mg/dL — ABNORMAL HIGH (ref 2.3–7.6)

## 2023-12-10 LAB — MICROALBUMIN/CREATININE RATIO, URINE, RANDOM
CREATININE RANDOM URINE: 252 mg/dL — ABNORMAL HIGH (ref 30–125)
MICROALBUMIN RANDOM URINE: 11.2 mg/dL
MICROALBUMIN/CREATININE RATIO RANDOM URINE: 44.4 mg/g

## 2023-12-10 LAB — PROTEIN/CREATININE RATIO, URINE, RANDOM
CREATININE RANDOM URINE: 252 mg/dL — ABNORMAL HIGH (ref 30–125)
PROTEIN RANDOM URINE: 33 mg/dL — ABNORMAL LOW (ref 50–80)
PROTEIN/CREATININE RATIO RANDOM URINE: 0.131 mg/mg (ref 0.000–200.000)

## 2023-12-10 LAB — PHOSPHORUS: PHOSPHORUS: 5 mg/dL (ref 3.7–7.2)

## 2023-12-10 LAB — MAGNESIUM: MAGNESIUM: 2.3 mg/dL (ref 1.9–2.7)

## 2023-12-10 LAB — VITAMIN D 25 TOTAL: VITAMIN D 25, TOTAL: 45.88 ng/mL (ref 30.00–100.00)

## 2023-12-10 LAB — PARATHYROID HORMONE (PTH): PTH: 56 pg/mL (ref 12.0–88.0)

## 2023-12-11 LAB — KAPPA AND LAMBDA FREE LIGHT CHAINS, SERUM
KAPPA FREE LIGHT CHAINS: 5.21 mg/dL — ABNORMAL HIGH (ref 1.25–3.25)
KAPPA/LAMBDA FLC RATIO: 1.95 (ref 0.80–2.10)
LAMBDA FREE LIGHT CHAINS: 2.67 mg/dL (ref 0.60–2.70)

## 2023-12-11 LAB — MONOCLONAL GAMMOPATHY PROFILE WITH IMMUNOTYPING REFLEX
ALBUMIN: 4.1 g/dL (ref 3.4–4.8)
KAPPA FREE LIGHT CHAINS: 5.21 mg/dL — ABNORMAL HIGH (ref 1.25–3.25)
KAPPA/LAMBDA FLC RATIO: 1.95 (ref 0.80–2.10)
LAMBDA FREE LIGHT CHAINS: 2.67 mg/dL (ref 0.60–2.70)
TOTAL PROTEIN: 7.3 g/dL

## 2023-12-11 LAB — ANTI-DOUBLE STRANDED DNA AB: ANTI DNA ANTIBODY: NEGATIVE

## 2023-12-11 LAB — ALBUMIN FOR ELECTROPHORESIS: ALBUMIN: 4.1 g/dL (ref 3.4–4.8)

## 2023-12-11 LAB — HEPATITIS A (HAV) IGM ANTIBODY: HAV IGM: NEGATIVE

## 2023-12-11 LAB — C4 COMPLEMENT, SERUM: C4 COMPLEMENT: 33 mg/dL (ref 12–39)

## 2023-12-11 LAB — HEPATITIS C ANTIBODY SCREEN WITH REFLEX TO HCV PCR: HCV ANTIBODY QUALITATIVE: NEGATIVE

## 2023-12-11 LAB — HEPATITIS B SURFACE ANTIGEN: HBV SURFACE ANTIGEN QUALITATIVE: NEGATIVE

## 2023-12-11 LAB — HEPATITIS B CORE IGM, AB: HBV CORE IGM ANTIBODY QUALITATIVE: NEGATIVE

## 2023-12-11 LAB — C3 COMPLEMENT, SERUM: C3 COMPLEMENT: 166 mg/dL — ABNORMAL HIGH (ref 81–157)

## 2023-12-11 LAB — PROTEIN FOR ELECTROPHORESIS: PROTEIN TOTAL: 7.3 g/dL (ref 5.6–7.6)

## 2023-12-11 LAB — HEP-2 SUBSTRATE ANTINUCLEAR ANTIBODIES (ANA), SERUM: ANA INTERPRETATION: NEGATIVE

## 2023-12-12 LAB — ANTI-STREPTOLYSIN O: ANTI-STREPTOLYSIN O: 39 [IU]/mL (ref <200)

## 2023-12-17 ENCOUNTER — Ambulatory Visit (INDEPENDENT_AMBULATORY_CARE_PROVIDER_SITE_OTHER): Payer: Self-pay

## 2023-12-17 ENCOUNTER — Other Ambulatory Visit: Payer: Self-pay

## 2023-12-17 ENCOUNTER — Encounter (INDEPENDENT_AMBULATORY_CARE_PROVIDER_SITE_OTHER): Payer: Self-pay

## 2023-12-17 VITALS — BP 133/69 | HR 76 | Ht 70.0 in | Wt 209.0 lb

## 2023-12-17 DIAGNOSIS — R809 Proteinuria, unspecified: Secondary | ICD-10-CM

## 2023-12-17 DIAGNOSIS — E559 Vitamin D deficiency, unspecified: Secondary | ICD-10-CM

## 2023-12-17 DIAGNOSIS — E790 Hyperuricemia without signs of inflammatory arthritis and tophaceous disease: Secondary | ICD-10-CM

## 2023-12-17 DIAGNOSIS — I10 Essential (primary) hypertension: Secondary | ICD-10-CM

## 2023-12-17 DIAGNOSIS — I129 Hypertensive chronic kidney disease with stage 1 through stage 4 chronic kidney disease, or unspecified chronic kidney disease: Secondary | ICD-10-CM

## 2023-12-17 DIAGNOSIS — N184 Chronic kidney disease, stage 4 (severe): Secondary | ICD-10-CM

## 2023-12-17 DIAGNOSIS — Z794 Long term (current) use of insulin: Secondary | ICD-10-CM

## 2023-12-17 DIAGNOSIS — E1122 Type 2 diabetes mellitus with diabetic chronic kidney disease: Secondary | ICD-10-CM

## 2023-12-17 MED ORDER — CHLORTHALIDONE 25 MG TABLET: 25.0000 mg | ORAL_TABLET | Freq: Every day | ORAL | 4 refills | Status: DC

## 2023-12-17 NOTE — Progress Notes (Signed)
 NEPHROLOGY, [facility] : [facility]  [address]  [place] [address]    Progress Note    Name: Matthew Sparks MRN:  [id number]   Date: 12/17/2023 DOB:  [birth year] (68 y.o.)              Nephrology Out  Patient Visit       HPI : 68 y.o.   male here for chronic kidney disease :creatinine increased 2.50  GFR 26 CKD Stage IV. Past medical history chronic kidney disease, hypertension, insulin-dependent diabetes mellitus type II, and hyperlipidemia.   Currently taking Farxiga . States he has been working outside. He is taking  Chlorthalidone  50 mg daily. Will Decrease dose. Instructed him to keep log of f blood pressure and to drink 45-50 oz of water daily. Will repeat labs in two weeks.  Denies hematuria and dysuria.  Currently has a Dexcom continuous blood sugar monitoring.  States blood sugar has been in good control.  Hemoglobin A1c was less than 7.  Blood pressure has been good control.  States blood pressure averages 120s to 130's.  He reports no problems with edema.  No edema noted today       ROS:      Assessment  was negative except what mentioned in in the HPI.       OBJECTIVE:   BP 133/69   Pulse 76   Ht 1.778 m (5' 10)   Wt 94.8 kg (209 lb)   BMI 29.99 kg/m       General:  NAD, AAOx3  HEENT:  EOMI,  no icterus  NECK: No increased JVD.  Normal inspection   HEART:   Regular rhythm. No murmurs or rubs. No chest wall tenderness   LUNGS: Clear to auscultation bilateral. No wheezes, rales, or rhonchi.   ABDOMEN: +BS, Soft, nontender and nondistended. No rebound or guarding present.   EXTREMITIES: No edema. No cyanosis or clubbing.No asterixis    NEURO : Normal speech, EOMI.       LABORATORY DATA:   Lab Results   Component Value Date    BUN 46 (H) 12/10/2023    BUN 25 09/10/2023    BUN 30 (H) 08/05/2023    CREATININE 2.59 (H) 12/10/2023    CREATININE 1.72 (H) 09/10/2023    CREATININE 1.73 (H) 08/05/2023    BUNCRRATIO 18 12/10/2023    BUNCRRATIO 15 09/10/2023    BUNCRRATIO 17 08/05/2023    GFR 26 (L)  12/10/2023    GFR 42 (L) 09/10/2023    GFR 42 (L) 08/05/2023    SODIUM 140 12/10/2023    SODIUM 143 09/10/2023    SODIUM 145 08/05/2023    POTASSIUM 4.1 12/10/2023    POTASSIUM 3.6 09/10/2023    POTASSIUM 3.6 08/05/2023    CHLORIDE 108 (H) 12/10/2023    CHLORIDE 104 09/10/2023    CHLORIDE 107 08/05/2023    CO2 23 12/10/2023    CO2 27 09/10/2023    CO2 26 08/05/2023    ANIONGAP 9 12/10/2023    ANIONGAP 12 09/10/2023    ANIONGAP 12 08/05/2023    CALCIUM 9.6 12/10/2023    CALCIUM 10.1 09/10/2023    CALCIUM 9.8 08/05/2023    PHOSPHORUS 5.0 12/10/2023    PHOSPHORUS 3.4 (L) 10/08/2022    PHOSPHORUS 3.8 03/27/2022    ALBUMIN 4.1 12/10/2023    ALBUMIN 4.5 10/09/2022    ALBUMIN 4.8 10/08/2022    HGB 12.8 12/10/2023    HGB 14.2 09/10/2023    HGB 14.1 08/05/2023  HCT 38.5 12/10/2023    HCT 43.6 09/10/2023    HCT 44.0 08/05/2023    INTACTPTH 56.0 12/10/2023    INTACTPTH 31.0 09/10/2023    INTACTPTH 57.1 08/05/2023    IRON 112 10/08/2022    IRONBINDCAP 424 10/08/2022    IRONSAT 26 10/08/2022           MEDICATIONS:  Outpatient Medications Marked as Taking for the 12/17/23 encounter (Office Visit) with Cathryne Iha, NP   Medication Sig    albuterol (PROVENTIL) 2.5 mg/0.5 mL Inhalation Solution for Nebulization Take 0.5 mL (2.5 mg total) by nebulization Three times a day    albuterol sulfate (PROVENTIL OR VENTOLIN OR PROAIR) 90 mcg/actuation Inhalation HFA Aerosol Inhaler Take 1-2 Puffs by inhalation Every 6 hours as needed    allopurinoL  (ZYLOPRIM ) 100 mg Oral Tablet Take 1 Tablet (100 mg total) by mouth Daily for 90 days    amLODIPine  (NORVASC) 10 mg Oral Tablet Take 1 Tablet (10 mg total) by mouth Once a day Indications: high blood pressure    aspirin 325 mg Oral Tablet Take 81 mg by mouth Once a day  (Patient taking differently: Take 1 Tablet (325 mg total) by mouth Daily)    atorvastatin  (LIPITOR) 80 mg Oral Tablet Take 1 Tablet (80 mg total) by mouth Every evening    chlorTHALIDONE  (HYGROTON ) 50 mg Oral Tablet Take 1  Tablet (50 mg total) by mouth Once a day    clopidogreL  (PLAVIX ) 75 mg Oral Tablet TAKE 1 TABLET(75 MG) BY MOUTH EVERY DAY    dapagliflozin  propanediol (FARXIGA ) 10 mg Oral Tablet Take 1 Tablet (10 mg total) by mouth Daily for 90 days    DEXCOM G7 SENSOR Does not apply Device REPLACE SENSOR EVERY 10 DAYS    ergocalciferol , vitamin D2, (DRISDOL ) 1,250 mcg (50,000 unit) Oral Capsule Take 1 Capsule (50,000 Units total) by mouth Every 7 days    glimepiride (AMARYL) 4 mg Oral Tablet Take 0.5 Tablets (2 mg total) by mouth Twice daily (Patient taking differently: Take 1 Tablet (4 mg total) by mouth Twice daily)    ipratropium-albuterol 0.5 mg-3 mg(2.5 mg base)/3 mL Solution for Nebulization 3 mL    isosorbide mononitrate (IMDUR) 30 mg Oral Tablet Sustained Release 24 hr Take 1 Tablet (30 mg total) by mouth Once a day    losartan (COZAAR) 25 mg Oral Tablet Take 1 Tablet (25 mg total) by mouth Once a day    metoprolol  succinate (TOPROL -XL) 100 mg Oral Tablet Sustained Release 24 hr Take 1 Tablet (100 mg total) by mouth Once a day    metoprolol  succinate (TOPROL -XL) 50 mg Oral Tablet Sustained Release 24 hr Take 1 Tablet (50 mg total) by mouth Once a day    MOUNJARO 7.5 mg/0.5 mL Subcutaneous Pen Injector Inject 0.5 mL (7.5 mg total) under the skin Every 7 days    multivitamin Oral Tablet Take 1 Tablet by mouth Once a day    nitroGLYCERIN (NITROSTAT) 0.3 mg Sublingual Tablet, Sublingual Place 1 Tablet (0.3 mg total) under the tongue Every 5 minutes as needed for Chest pain for 3 doses over 15 minutes    omega 3-dha-epa-fish oil 183.3 mg-75 mg -91.6 mg-306 mg Oral Capsule Take 1 Capsule by mouth Once a day    potassium chloride  (K-DUR) 10 mEq Oral Tab Sust.Rel. Particle/Crystal Take 1 Tablet (10 mEq total) by mouth Once a day    TOUJEO SOLOSTAR U-300 INSULIN 300 unit/mL (1.5 mL) Subcutaneous Insulin Pen Sliding scale  Current Outpatient Medications   Medication Instructions    albuterol (PROVENTIL) 2.5 mg, 3 TIMES DAILY     albuterol sulfate (PROVENTIL OR VENTOLIN OR PROAIR) 90 mcg/actuation Inhalation HFA Aerosol Inhaler 1-2 Puffs, EVERY 6 HOURS PRN    allopurinoL  (ZYLOPRIM ) 100 mg, Oral, Daily    amLODIPine  (NORVASC) 10 mg, Oral, Daily    aspirin 81 mg, Daily    atorvastatin  (LIPITOR) 80 mg, Oral, EVERY EVENING    chlorTHALIDONE  (HYGROTON ) 50 mg, Daily    clopidogreL  (PLAVIX ) 75 mg, Oral, Daily    dapagliflozin  propanediol (FARXIGA ) 10 mg, Oral, Daily    DEXCOM G7 SENSOR Does not apply Device REPLACE SENSOR EVERY 10 DAYS    ergocalciferol  (vitamin D2) (DRISDOL ) 50,000 Units, Oral, EVERY 7 DAYS    glimepiride (AMARYL) 2 mg, 2 TIMES DAILY    ipratropium-albuterol 0.5 mg-3 mg(2.5 mg base)/3 mL Solution for Nebulization 3 mL    isosorbide mononitrate (IMDUR) 30 mg, Daily    losartan (COZAAR) 25 mg, Daily    metoprolol  succinate (TOPROL -XL) 100 mg, Oral, Daily    metoprolol  succinate (TOPROL -XL) 50 mg, Daily    MOUNJARO 7.5 mg/0.5 mL Subcutaneous Pen Injector 1 Each, EVERY 7 DAYS    multivitamin Oral Tablet 1 Tablet, Daily    nitroGLYCERIN (NITROSTAT) 0.3 mg, EVERY 5 MIN PRN    omega 3-dha-epa-fish oil 183.3 mg-75 mg -91.6 mg-306 mg Oral Capsule 1 Capsule, Daily    potassium chloride  (K-DUR) 10 mEq Oral Tab Sust.Rel. Particle/Crystal 10 mEq, Oral, Daily    TOUJEO SOLOSTAR U-300 INSULIN 300 unit/mL (1.5 mL) Subcutaneous Insulin Pen Sliding scale       ASSESSMENT / PLAN:   ENCOUNTER DIAGNOSES     ICD-10-CM   1. CKD (chronic kidney disease) stage 4, GFR 15-29 ml/min (CMS HCC)  N18.4   2. Hypertension, unspecified type  I10   3. Vitamin D  deficiency  E55.9              Chronic kidney disease  -Stage IV  -Due to diabetes and hypertension  -Creatinine is 2.59 /GFR 26  -Baseline creatinine :  1.92  -Total protein to creatinine ratio:  improved  0.13 from 1.09  -CBC and a basic metabolic panel  -Return to clinic in 3 months  -Continue low-sodium diet  -balanced Fluid intake 50  oz a day.    -Avoid NSAIDs.  Tylenol  only for pain  -Renal imaging:  No hydronephrosis no cysts or large masses  -ACEI or ARBS:  Losartan 25 mg daily  -Sodium Glucose Cotransporter-2 (SGLT2) Inhibitors: Farxiga  10 mg daily           Proteinuria  -negative   -serology          Hypertension  -Blood pressure is acceptable  -Goal less than 130/80  -Continue losartan 25 mg daily  -continue amlodipine  10 mg daily  -chlorthalidone  50 mg daily  -Low-salt diet        Diabetes type 2  -A1c goal is less than 7   Lab Results   Component Value Date    HA1C 7.6 (H) 11/03/2023      -Continue Farxiga  10 mg daily  -continue sliding scale insulin  -Diabetic diet  -Increase activity and exercise as possible  -Monitor A1C        Hyperuricemia  -Check uric acid   Lab Results   Component Value Date    URICACID 8.1 (H) 12/10/2023       -Low uric acid diet  -allopurinol  100 mg  daily         Vitamin D  deficiency  -vitamin-D level acceptable   -continue to monitor vitamin-D level        I have discussed with the patient the following issues:  The main goal of treatment is to prevent progression of CKD to complete kidney failure. The best way to do this is to control the underlying cause.  the optimal diet is one similar to the Dietary Approaches to Stop Hypertension (DASH) diet, consisting of fruits, vegetables, legumes, fish, poultry, and whole grains.  Restrict sodium intake to less than 2 g/day        Orders Placed This Encounter    BASIC METABOLIC PANEL    CBC/DIFF    MAGNESIUM     VITAMIN D  25 TOTAL    URIC ACID    PROTEIN/CREATININE RATIO, URINE, RANDOM    PARATHYROID  HORMONE (PTH)    MICROALBUMIN/CREATININE RATIO, URINE, RANDOM       Suzen Risser, NP

## 2023-12-17 NOTE — Nursing Note (Signed)
 Follow up labs

## 2023-12-31 ENCOUNTER — Emergency Department
Admission: EM | Admit: 2023-12-31 | Discharge: 2023-12-31 | Disposition: A | Discharge status: Home / Self Care | Attending: NURSE PRACTITIONER | Admitting: NURSE PRACTITIONER

## 2023-12-31 ENCOUNTER — Other Ambulatory Visit: Payer: Self-pay

## 2023-12-31 ENCOUNTER — Encounter (HOSPITAL_COMMUNITY): Payer: Self-pay | Admitting: NURSE PRACTITIONER

## 2023-12-31 ENCOUNTER — Emergency Department (HOSPITAL_COMMUNITY)

## 2023-12-31 DIAGNOSIS — R079 Chest pain, unspecified: Secondary | ICD-10-CM | POA: Insufficient documentation

## 2023-12-31 DIAGNOSIS — I10 Essential (primary) hypertension: Secondary | ICD-10-CM | POA: Insufficient documentation

## 2023-12-31 LAB — COMPREHENSIVE METABOLIC PANEL, NON-FASTING
ALBUMIN/GLOBULIN RATIO: 1.5 — ABNORMAL HIGH (ref 0.8–1.4)
ALBUMIN: 4.6 g/dL (ref 3.5–5.7)
ALKALINE PHOSPHATASE: 64 U/L (ref 34–104)
ALT (SGPT): 42 U/L (ref 7–52)
ANION GAP: 9 mmol/L (ref 4–13)
AST (SGOT): 32 U/L (ref 13–39)
BILIRUBIN TOTAL: 0.6 mg/dL (ref 0.3–1.0)
BUN/CREA RATIO: 18 (ref 6–22)
BUN: 44 mg/dL — ABNORMAL HIGH (ref 7–25)
CALCIUM, CORRECTED: 9.8 mg/dL (ref 8.9–10.8)
CALCIUM: 10.3 mg/dL (ref 8.6–10.3)
CHLORIDE: 114 mmol/L — ABNORMAL HIGH (ref 98–107)
CO2 TOTAL: 18 mmol/L — ABNORMAL LOW (ref 21–31)
CREATININE: 2.47 mg/dL — ABNORMAL HIGH (ref 0.60–1.30)
ESTIMATED GFR: 28 mL/min/1.73mˆ2 — ABNORMAL LOW (ref >59)
GLOBULIN: 3.1 (ref 2.0–3.5)
GLUCOSE: 128 mg/dL — ABNORMAL HIGH (ref 74–109)
OSMOLALITY, CALCULATED: 294 mosm/kg — ABNORMAL HIGH (ref 270–290)
POTASSIUM: 3.6 mmol/L (ref 3.5–5.1)
PROTEIN TOTAL: 7.7 g/dL (ref 6.4–8.9)
SODIUM: 141 mmol/L (ref 136–145)

## 2023-12-31 LAB — CBC WITH DIFF
BASOPHIL #: 0 x10ˆ3/uL (ref 0.00–0.10)
BASOPHIL %: 1 % (ref 0–1)
EOSINOPHIL #: 0.3 x10ˆ3/uL (ref 0.00–0.60)
EOSINOPHIL %: 4 % (ref 1–8)
HCT: 35.2 % — ABNORMAL LOW (ref 36.7–47.1)
HGB: 11.8 g/dL — ABNORMAL LOW (ref 12.5–16.3)
LYMPHOCYTE #: 1.7 x10ˆ3/uL (ref 1.00–3.00)
LYMPHOCYTE %: 24 % (ref 15–43)
MCH: 29.3 pg (ref 23.8–33.4)
MCHC: 33.6 g/dL (ref 32.5–36.3)
MCV: 87 fL (ref 73.0–96.2)
MONOCYTE #: 0.6 x10ˆ3/uL (ref 0.30–1.10)
MONOCYTE %: 8 % (ref 6–14)
MPV: 10.2 fL (ref 7.4–11.4)
NEUTROPHIL #: 4.5 x10ˆ3/uL (ref 1.85–7.84)
NEUTROPHIL %: 63 % (ref 44–74)
PLATELETS: 169 x10ˆ3/uL (ref 140–440)
RBC: 4.04 x10ˆ6/uL — ABNORMAL LOW (ref 4.06–5.63)
RDW: 15.4 % (ref 12.1–16.2)
WBC: 7.2 x10ˆ3/uL (ref 3.6–10.2)

## 2023-12-31 LAB — PTT (PARTIAL THROMBOPLASTIN TIME): APTT: 35.9 s (ref 25.0–38.0)

## 2023-12-31 LAB — TROPONIN-I
TROPONIN I: 13 ng/L (ref <20)
TROPONIN I: 12 ng/L (ref <20)
TROPONIN I: 10 ng/L (ref <20)

## 2023-12-31 LAB — PT/INR
INR: 1.04 (ref 0.84–1.10)
PROTHROMBIN TIME: 11.8 s (ref 9.8–12.7)

## 2023-12-31 MED ORDER — LIDOCAINE HCL 2 % MUCOSAL SOLUTION
15.0000 mL | Freq: Once | Status: AC
Start: 2023-12-31 — End: 2023-12-31
  Administered 2023-12-31: 15 mL via ORAL

## 2023-12-31 MED ORDER — METHOCARBAMOL 500 MG TABLET
500.0000 mg | ORAL_TABLET | Freq: Three times a day (TID) | ORAL | 0 refills | Status: AC
Start: 2023-12-31 — End: 2024-01-05

## 2023-12-31 MED ORDER — METHOCARBAMOL 500 MG TABLET
1000.0000 mg | ORAL_TABLET | ORAL | Status: AC
Start: 2023-12-31 — End: 2023-12-31
  Administered 2023-12-31: 1000 mg via ORAL

## 2023-12-31 MED ORDER — HYOSCYAMINE 0.125 MG/5 ML ORAL ELIXIR
10.0000 mL | ORAL_SOLUTION | Freq: Once | ORAL | Status: AC
Start: 2023-12-31 — End: 2023-12-31
  Administered 2023-12-31: 10 mL via ORAL

## 2023-12-31 MED ORDER — LIDOCAINE HCL 2 % MUCOSAL SOLUTION
Status: AC
Start: 2023-12-31 — End: 2023-12-31
  Filled 2023-12-31: qty 15

## 2023-12-31 MED ORDER — ALUMINUM-MAG HYDROXIDE-SIMETHICONE 200 MG-200 MG-20 MG/5 ML ORAL SUSP
30.0000 mL | Freq: Once | ORAL | Status: AC
Start: 2023-12-31 — End: 2023-12-31
  Administered 2023-12-31: 30 mL via ORAL

## 2023-12-31 MED ORDER — METHOCARBAMOL 500 MG TABLET
ORAL_TABLET | ORAL | Status: AC
Start: 2023-12-31 — End: 2023-12-31
  Filled 2023-12-31: qty 2

## 2023-12-31 MED ORDER — HYOSCYAMINE 0.125 MG/5 ML ORAL ELIXIR
ORAL_SOLUTION | ORAL | Status: AC
Start: 2023-12-31 — End: 2023-12-31
  Filled 2023-12-31: qty 10

## 2023-12-31 MED ORDER — ASPIRIN 81 MG CHEWABLE TABLET
324.0000 mg | CHEWABLE_TABLET | ORAL | Status: DC
Start: 2023-12-31 — End: 2023-12-31
  Administered 2023-12-31: 0 mg via ORAL

## 2023-12-31 MED ORDER — ALUMINUM-MAG HYDROXIDE-SIMETHICONE 200 MG-200 MG-20 MG/5 ML ORAL SUSP
ORAL | Status: AC
Start: 2023-12-31 — End: 2023-12-31
  Filled 2023-12-31: qty 30

## 2023-12-31 NOTE — ED Provider Notes (Signed)
 [facility]  ED Primary Provider Note  Patient Name: Matthew Sparks  Patient Age: 68 y.o.  Date of Birth: [birth year]    Chief Complaint: Chest Pain         History of Present Illness       Matthew Sparks is a 68 y.o. male who had concerns including Chest Pain .  Patient presents to ED today for chest pain.  Patient states he has been present over the past 2 weeks.  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  Patient states the pain is in the left side of his chest.  Patient denies any new shortness of breath.  Patient denies lower extremity swelling.  Patient does has a history of hypertension.  Patient takes his medication in the morning and took them prior to arrival.  Patient states that his blood pressure has been elevated since he started having chest pain.        Review of Systems     No other overt Review of Systems are noted to be positive except noted in the HPI.      Historical Data   History Reviewed This Encounter: Medical History  Surgical History  Family History  Social History      Physical Exam   ED Triage Vitals [12/31/23 1056]   BP (Non-Invasive) (!) 150/85   Heart Rate 78   Respiratory Rate 18   Temperature 36.2 C (97.1 F)   SpO2 96 %   Weight 89.8 kg (198 lb)   Height 1.778 m (5' 10)         Nursing notes reviewed for what could be assessed. Past Medical, Surgical, and Social history reviewed for what has been completed.     Constitutional: NAD. Well-Developed. Well Nourished.  Head: Normocephalic, atraumatic.  Mouth/Throat:  Moist mucous membranes.  Eyes: EOM grossly intact, conjunctiva normal.  Neck: Supple  Cardiovascular: Regular Rate and Rhythm, extremities well perfused.  Pulmonary/Chest: No respiratory distress. Lungs are symmetric to auscultation bilaterally.    Abdominal: Soft, non-tender, non-distended. Non peritoneal, no rebound, no guarding.  MSK:  No Lower Extremity Edema.  Skin: Warm, dry, and intact  Neuro: Appropriate, CN II-XII grossly intact. Gait at patient's baseline  Psych: Pleasant             Procedures      Patient Data     Labs Ordered/Reviewed   COMPREHENSIVE METABOLIC PANEL, NON-FASTING - Abnormal; Notable for the following components:       Result Value    CHLORIDE 114 (*)     CO2 TOTAL 18 (*)     BUN 44 (*)     CREATININE 2.47 (*)     ESTIMATED GFR 28 (*)     GLUCOSE 128 (*)     ALBUMIN/GLOBULIN RATIO 1.5 (*)     OSMOLALITY, CALCULATED 294 (*)     All other components within normal limits    Narrative:     Estimated Glomerular Filtration Rate (eGFR) is calculated using the CKD-EPI (2021) equation, intended for patients 39 years of age and older. If gender is not documented or unknown, there will be no eGFR calculation.     CBC WITH DIFF - Abnormal; Notable for the following components:    RBC 4.04 (*)     HGB 11.8 (*)     HCT  35.2 (*)     All other components within normal limits   PT/INR - Normal    Narrative:     In the setting of warfarin therapy, a moderate-intensity INR goal range is 2.0 to 3.0 and a high-intensity INR goal range is 2.5 to 3.5.    INR is ONLY validated to determine the level of anticoagulation with vitamin K antagonists (warfarin). Other factors may elevate the INR including but not limited to direct oral anticoagulants (DOACs), liver dysfunction, vitamin K deficiency, DIC, factor deficiencies, and factor inhibitors.   PTT (PARTIAL THROMBOPLASTIN TIME) - Normal   TROPONIN-I - Normal   TROPONIN-I - Normal   TROPONIN-I - Normal   CBC/DIFF    Narrative:     The following orders were created for panel order CBC/DIFF.  Procedure                               Abnormality         Status                     ---------                               -----------         ------                     CBC WITH DIFF[[id number]]                Abnormal            Final result                 Please view results for these tests on the  individual orders.   GRAY TOP TUBE       XR CHEST PA AND LATERAL   Final Result by Edi, Radresults In (07/10 1301)   NO ACUTE FINDINGS.         Radiologist location ID: [id number]             Medical Decision Making          Medical Decision Making  Amount and/or Complexity of Data Reviewed  Labs: ordered.  Radiology: ordered. Decision-making details documented in ED Course.  ECG/medicine tests: ordered and independent interpretation performed.    Risk  OTC drugs.  Prescription drug management.          Studies Assessed:  Labs, imaging, EKG    MDM Narrative:  Patient presents to ED today for chest pain.  Patient states he has been present over the past 2 weeks.  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  Patient states the pain is in the left side of his chest.  Patient denies any new shortness of breath.  Patient denies lower extremity swelling.  Patient does has a history of hypertension.  Patient takes his medication in the morning and took them prior to arrival.  Patient states that his blood pressure has been elevated since he started having chest pain.  Patient's troponins were flat.  Patient's chest pain did come down significantly.  Patient was given Robaxin  as his chest pain is reproducible with palpation on the chest.  Patient states this did alleviate his  chest pain.  Patient was discharged.      ED Course as of 12/31/23 1943   Thu Dec 31, 2023   1159 TROPONIN-I: 13   1300 TROPONIN-I: 12   1303 XR CHEST PA AND LATERAL  IMPRESSION:  NO ACUTE FINDINGS.     1439 TROPONIN-I: 10   1506 Upon reassessment patient states his chest pain has decreased significantly.   1506 Chest pain was reproducible with palpation.         Medications Ordered/Administered in the ED   aluminum -magnesium  hydroxide-simethicone  (MAG-AL PLUS) 200-200-20 mg per 5 mL oral liquid (30 mL Oral Given 12/31/23 1233)     And   hyoscyamine   (HYOSYNE ) 0.125 mg per 5 mL oral liquid (10 mL Oral Given 12/31/23 1234)     And   lidocaine  (XYLOCAINE ) 2% oral topical viscous solution (15 mL Oral Given 12/31/23 1233)   methocarbamol  (ROBAXIN ) tablet (1,000 mg Oral Given 12/31/23 1452)       Following the history, physical exam, and ED workup, the patient was deemed stable and suitable for discharge. The patient/caregiver was advised to return to the ED for any new or worsening symptoms. Discharge medications, and follow-up instructions were discussed with the patient/caregiver in detail, who verbalizes understanding. The patient/caregiver is in agreement and is comfortable with the plan of care.    Disposition: Discharged         Current Discharge Medication List        START taking these medications.        Details   methocarbamoL  500 mg Tablet  Commonly known as: ROBAXIN    500 mg, Oral, 3 TIMES DAILY  Qty: 15 Tablet  Refills: 0            CONTINUE these medications - NO CHANGES were made during your visit.        Details   * albuterol sulfate 90 mcg/actuation oral inhaler  Commonly known as: PROVENTIL or VENTOLIN or PROAIR   1-2 Puffs, EVERY 6 HOURS PRN  Refills: 0     * albuterol 2.5 mg/0.5 mL Solution for Nebulization  Commonly known as: PROVENTIL   2.5 mg, 3 TIMES DAILY  Refills: 0     allopurinoL  100 mg Tablet  Commonly known as: ZYLOPRIM    100 mg, Oral, Daily  Qty: 90 Tablet  Refills: 3     amLODIPine  10 mg Tablet  Commonly known as: NORVASC   10 mg, Oral, Daily  Qty: 90 Tablet  Refills: 0     aspirin  325 mg Tablet   81 mg, Daily  Refills: 0     atorvastatin  80 mg Tablet  Commonly known as: LIPITOR   80 mg, Oral, EVERY EVENING  Qty: 90 Tablet  Refills: 1     chlorTHALIDONE  25 mg Tablet  Commonly known as: HYGROTON    25 mg, Oral, Daily  Qty: 90 Tablet  Refills: 4     clopidogreL  75 mg Tablet  Commonly known as: PLAVIX    75 mg, Oral, Daily  Qty: 90 Tablet  Refills: 1     dapagliflozin  propanediol 10 mg Tablet  Commonly known as: FARXIGA    10 mg, Oral,  Daily  Qty: 90 Tablet  Refills: 3     Dexcom G7 Sensor Device  Generic drug: Blood-Glucose Sensor   REPLACE SENSOR EVERY 10 DAYS  Refills: 0     ergocalciferol  (vitamin D2) 1,250 mcg (50,000 unit) Capsule  Commonly known as: DRISDOL    50,000 Units, Oral, EVERY 7 DAYS  Qty: 12 Capsule  Refills: 0     glimepiride 4 mg Tablet  Commonly known as: AMARYL   2 mg, 2 TIMES DAILY  Refills: 0     ipratropium-albuteroL 0.5 mg-3 mg(2.5 mg base)/3 mL nebulizer solution  Commonly known as: DUONEB   3 mL  Refills: 0     isosorbide mononitrate 30 mg Tablet Sustained Release 24 hr  Commonly known as: IMDUR   30 mg, Daily  Refills: 0     losartan 25 mg Tablet  Commonly known as: COZAAR   25 mg, Daily  Refills: 0     * metoprolol  succinate 100 mg Tablet Sustained Release 24 hr  Commonly known as: TOPROL -XL   100 mg, Oral, Daily  Qty: 90 Tablet  Refills: 1     * metoprolol  succinate 50 mg Tablet Sustained Release 24 hr  Commonly known as: TOPROL -XL   50 mg, Daily  Refills: 0     Mounjaro 7.5 mg/0.5 mL Pen Injector  Generic drug: tirzepatide   1 Each, EVERY 7 DAYS  Refills: 0     multivitamin Tablet   1 Tablet, Daily  Refills: 0     nitroGLYCERIN 0.3 mg Tablet, Sublingual  Commonly known as: NITROSTAT   0.3 mg, EVERY 5 MIN PRN  Refills: 0     omega 3-dha-epa-fish oil 183.3 mg-75 mg -91.6 mg-306 mg Capsule   1 Capsule, Daily  Refills: 0     potassium chloride  10 mEq Tab Sust.Rel. Particle/Crystal  Commonly known as: K-DUR   10 mEq, Oral, Daily  Qty: 90 Tablet  Refills: 1     Toujeo SoloStar U-300 Insulin 300 unit/mL (1.5 mL) Insulin Pen  Generic drug: insulin glargine U-300 conc   Sliding scale  Refills: 0           * This list has 4 medication(s) that are the same as other medications prescribed for you. Read the directions carefully, and ask your doctor or other care provider to review them with you.                Follow up:   Jarold City, FNP  [address]  [place] [address]  [phone]                     Clinical  Impression   Chest pain, unspecified type (Primary)         Discharge Medication List as of 12/31/2023  3:10 PM        START taking these medications    Details   methocarbamoL  (ROBAXIN ) 500 mg Oral Tablet Take 1 Tablet (500 mg total) by mouth Three times a day for 5 days, Disp-15 Tablet, R-0, E-Rx                 MYRTIS Elbe FNP-C  Department of Emergency Medicine

## 2023-12-31 NOTE — ED Nurses Note (Signed)
 Pt c/o left side CP x2 weeks that was intermittent and is now constant. Pt also states he has HX of tumor to left arm and feels pain is radiating from area into chest. Pt

## 2023-12-31 NOTE — Discharge Instructions (Signed)

## 2023-12-31 NOTE — ED Triage Notes (Signed)
 Chest pain intermittent x2 weeks that is now constant.

## 2024-01-01 DIAGNOSIS — R9431 Abnormal electrocardiogram [ECG] [EKG]: Secondary | ICD-10-CM

## 2024-01-01 LAB — ECG 12 LEAD
Atrial Rate: 77 {beats}/min
Calculated P Axis: 59 degrees
Calculated R Axis: 52 degrees
Calculated T Axis: 9 degrees
PR Interval: 186 ms
QRS Duration: 102 ms
QT Interval: 374 ms
QTC Calculation: 423 ms
Ventricular rate: 77 {beats}/min

## 2024-01-08 NOTE — Telephone Encounter (Signed)
 Duplicate

## 2024-01-29 ENCOUNTER — Ambulatory Visit
Admission: RE | Admit: 2024-01-29 | Discharge: 2024-01-29 | Disposition: A | Discharge status: Home / Self Care | Source: Ambulatory Visit | Attending: NURSE PRACTITIONER | Admitting: NURSE PRACTITIONER

## 2024-01-29 ENCOUNTER — Other Ambulatory Visit: Payer: Self-pay

## 2024-01-29 ENCOUNTER — Other Ambulatory Visit (HOSPITAL_COMMUNITY): Payer: Self-pay | Admitting: NURSE PRACTITIONER

## 2024-01-29 DIAGNOSIS — M25512 Pain in left shoulder: Secondary | ICD-10-CM | POA: Insufficient documentation

## 2024-02-05 ENCOUNTER — Encounter (HOSPITAL_COMMUNITY): Payer: Self-pay | Admitting: NURSE PRACTITIONER

## 2024-02-05 ENCOUNTER — Encounter (HOSPITAL_COMMUNITY): Payer: Self-pay

## 2024-02-05 ENCOUNTER — Other Ambulatory Visit (HOSPITAL_COMMUNITY): Payer: Self-pay | Admitting: NURSE PRACTITIONER

## 2024-02-05 DIAGNOSIS — M25512 Pain in left shoulder: Secondary | ICD-10-CM

## 2024-02-10 ENCOUNTER — Ambulatory Visit: Attending: NURSE PRACTITIONER

## 2024-02-10 ENCOUNTER — Other Ambulatory Visit: Payer: Self-pay

## 2024-02-10 ENCOUNTER — Encounter (HOSPITAL_BASED_OUTPATIENT_CLINIC_OR_DEPARTMENT_OTHER): Payer: Self-pay

## 2024-02-10 ENCOUNTER — Ambulatory Visit: Payer: Self-pay | Attending: NURSE PRACTITIONER

## 2024-02-10 VITALS — BP 130/78 | HR 63 | Temp 97.3°F | Resp 18 | Ht 70.0 in | Wt 202.2 lb

## 2024-02-10 DIAGNOSIS — N1832 Chronic kidney disease, stage 3b: Secondary | ICD-10-CM

## 2024-02-10 DIAGNOSIS — E1122 Type 2 diabetes mellitus with diabetic chronic kidney disease: Secondary | ICD-10-CM

## 2024-02-10 DIAGNOSIS — N1831 Chronic kidney disease, stage 3a: Secondary | ICD-10-CM

## 2024-02-10 LAB — GLUCOSE, NON FASTING: GLUCOSE: 129 mg/dL — ABNORMAL HIGH (ref 74–109)

## 2024-02-10 NOTE — Patient Instructions (Addendum)
-   Labs ordered to have completed that will determine if you are Type 1 or 2 diabetes    -Please bring your reader by the office for us  to upload your glucose readings    -Recommend that you only take Glimepiride 1 tablet daily- until we review your labs and glucose readings    -You can continue to remain off of Mounjaro and insulin at this time-we will let you know what we recommend that you do based on your lab results and glucose readings.     -Follow-up appointment will be scheduled for 6 months or sooner if needed
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
## 2024-02-11 ENCOUNTER — Ambulatory Visit (HOSPITAL_BASED_OUTPATIENT_CLINIC_OR_DEPARTMENT_OTHER): Payer: Self-pay | Admitting: INTERNAL MEDICINE-ENDOCRINOLOGY-DIABETES AND METABOLISM

## 2024-02-11 LAB — C-PEPTIDE: C-PEPTIDE: 4.9 ng/mL (ref 1.4–6.8)

## 2024-02-29 ENCOUNTER — Other Ambulatory Visit (HOSPITAL_COMMUNITY): Payer: Self-pay | Admitting: Internal Medicine

## 2024-02-29 DIAGNOSIS — E119 Type 2 diabetes mellitus without complications: Secondary | ICD-10-CM

## 2024-02-29 DIAGNOSIS — R9431 Abnormal electrocardiogram [ECG] [EKG]: Secondary | ICD-10-CM

## 2024-02-29 DIAGNOSIS — R0609 Other forms of dyspnea: Secondary | ICD-10-CM

## 2024-02-29 DIAGNOSIS — R079 Chest pain, unspecified: Secondary | ICD-10-CM

## 2024-03-11 ENCOUNTER — Ambulatory Visit (INDEPENDENT_AMBULATORY_CARE_PROVIDER_SITE_OTHER): Payer: Self-pay

## 2024-03-11 ENCOUNTER — Other Ambulatory Visit: Payer: Self-pay

## 2024-03-11 ENCOUNTER — Other Ambulatory Visit

## 2024-03-11 DIAGNOSIS — E559 Vitamin D deficiency, unspecified: Secondary | ICD-10-CM

## 2024-03-11 DIAGNOSIS — N184 Chronic kidney disease, stage 4 (severe): Secondary | ICD-10-CM

## 2024-03-11 DIAGNOSIS — I10 Essential (primary) hypertension: Secondary | ICD-10-CM

## 2024-03-11 DIAGNOSIS — I129 Hypertensive chronic kidney disease with stage 1 through stage 4 chronic kidney disease, or unspecified chronic kidney disease: Secondary | ICD-10-CM

## 2024-03-11 LAB — BASIC METABOLIC PANEL
ANION GAP: 9 mmol/L (ref 4–13)
BUN/CREA RATIO: 18 (ref 6–22)
BUN: 34 mg/dL — ABNORMAL HIGH (ref 7–25)
CALCIUM: 9.9 mg/dL (ref 8.6–10.3)
CHLORIDE: 109 mmol/L — ABNORMAL HIGH (ref 98–107)
CO2 TOTAL: 23 mmol/L (ref 21–31)
CREATININE: 1.9 mg/dL — ABNORMAL HIGH (ref 0.60–1.30)
ESTIMATED GFR: 37 mL/min/1.73mˆ2 — ABNORMAL LOW (ref >59)
GLUCOSE: 183 mg/dL — ABNORMAL HIGH (ref 74–109)
OSMOLALITY, CALCULATED: 294 mosm/kg — ABNORMAL HIGH (ref 270–290)
POTASSIUM: 3.7 mmol/L (ref 3.5–5.1)
SODIUM: 141 mmol/L (ref 136–145)

## 2024-03-11 LAB — CBC WITH DIFF
BASOPHIL #: 0.1 x10ˆ3/uL (ref 0.00–0.10)
BASOPHIL %: 1 % (ref 0–1)
EOSINOPHIL #: 0.3 x10ˆ3/uL (ref 0.00–0.60)
EOSINOPHIL %: 4 % (ref 1–8)
HCT: 34.5 % — ABNORMAL LOW (ref 36.7–47.1)
HGB: 11.6 g/dL — ABNORMAL LOW (ref 12.5–16.3)
LYMPHOCYTE #: 1.8 x10ˆ3/uL (ref 1.00–3.00)
LYMPHOCYTE %: 26 % (ref 15–43)
MCH: 30.5 pg (ref 23.8–33.4)
MCHC: 33.7 g/dL (ref 32.5–36.3)
MCV: 90.5 fL (ref 73.0–96.2)
MONOCYTE #: 0.6 x10ˆ3/uL (ref 0.30–1.10)
MONOCYTE %: 8 % (ref 6–14)
MPV: 12.2 fL — ABNORMAL HIGH (ref 7.4–11.4)
NEUTROPHIL #: 4.2 x10ˆ3/uL (ref 1.85–7.84)
NEUTROPHIL %: 61 % (ref 44–74)
PLATELETS: 152 x10ˆ3/uL (ref 140–440)
RBC: 3.82 x10ˆ6/uL — ABNORMAL LOW (ref 4.06–5.63)
RDW: 14.4 % (ref 12.1–16.2)
WBC: 6.9 x10ˆ3/uL (ref 3.6–10.2)

## 2024-03-11 LAB — PROTEIN/CREATININE RATIO, URINE, RANDOM
CREATININE RANDOM URINE: 120 mg/dL (ref 30–125)
PROTEIN RANDOM URINE: 58 mg/dL (ref 50–80)
PROTEIN/CREATININE RATIO RANDOM URINE: 0.483 mg/mg (ref 0.000–200.000)

## 2024-03-11 LAB — MICROALBUMIN/CREATININE RATIO, URINE, RANDOM
CREATININE RANDOM URINE: 120 mg/dL (ref 30–125)
MICROALBUMIN RANDOM URINE: 33.1 mg/dL
MICROALBUMIN/CREATININE RATIO RANDOM URINE: 275.8 mg/g

## 2024-03-11 LAB — URIC ACID: URIC ACID: 8.4 mg/dL — ABNORMAL HIGH (ref 2.3–7.6)

## 2024-03-11 LAB — PARATHYROID HORMONE (PTH): PTH: 25.6 pg/mL (ref 12.0–88.0)

## 2024-03-11 LAB — VITAMIN D 25 TOTAL: VITAMIN D 25, TOTAL: 46.02 ng/mL (ref 30.00–100.00)

## 2024-03-11 LAB — MAGNESIUM: MAGNESIUM: 2 mg/dL (ref 1.9–2.7)

## 2024-03-12 ENCOUNTER — Ambulatory Visit
Admission: RE | Admit: 2024-03-12 | Discharge: 2024-03-12 | Disposition: A | Discharge status: Home / Self Care | Source: Ambulatory Visit | Attending: NURSE PRACTITIONER | Admitting: NURSE PRACTITIONER

## 2024-03-12 DIAGNOSIS — M25512 Pain in left shoulder: Secondary | ICD-10-CM | POA: Insufficient documentation

## 2024-03-12 MED ORDER — GADOBUTROL 10 MMOL/10 ML (1 MMOL/ML) INTRAVENOUS SOLUTION
10.0000 mL | INTRAVENOUS | Status: AC
Start: 2024-03-12 — End: 2024-03-12
  Administered 2024-03-12: 9 mL via INTRAVENOUS

## 2024-03-13 DIAGNOSIS — M19012 Primary osteoarthritis, left shoulder: Secondary | ICD-10-CM

## 2024-03-17 ENCOUNTER — Ambulatory Visit (HOSPITAL_COMMUNITY): Payer: Self-pay

## 2024-03-18 ENCOUNTER — Ambulatory Visit (INDEPENDENT_AMBULATORY_CARE_PROVIDER_SITE_OTHER): Payer: Self-pay

## 2024-03-18 ENCOUNTER — Ambulatory Visit (HOSPITAL_COMMUNITY): Payer: Self-pay

## 2024-03-18 ENCOUNTER — Other Ambulatory Visit: Payer: Self-pay

## 2024-03-18 ENCOUNTER — Encounter (INDEPENDENT_AMBULATORY_CARE_PROVIDER_SITE_OTHER): Payer: Self-pay

## 2024-03-18 VITALS — BP 160/72 | HR 62 | Ht 70.0 in | Wt 199.0 lb

## 2024-03-18 DIAGNOSIS — E1122 Type 2 diabetes mellitus with diabetic chronic kidney disease: Secondary | ICD-10-CM

## 2024-03-18 DIAGNOSIS — E559 Vitamin D deficiency, unspecified: Secondary | ICD-10-CM

## 2024-03-18 DIAGNOSIS — I129 Hypertensive chronic kidney disease with stage 1 through stage 4 chronic kidney disease, or unspecified chronic kidney disease: Secondary | ICD-10-CM

## 2024-03-18 DIAGNOSIS — E119 Type 2 diabetes mellitus without complications: Secondary | ICD-10-CM

## 2024-03-18 DIAGNOSIS — N1832 Chronic kidney disease, stage 3b: Secondary | ICD-10-CM

## 2024-03-18 DIAGNOSIS — M25512 Pain in left shoulder: Secondary | ICD-10-CM

## 2024-03-18 MED ORDER — DAPAGLIFLOZIN PROPANEDIOL 10 MG TABLET: 10.0000 mg | ORAL_TABLET | Freq: Every day | ORAL | 3 refills | Status: DC

## 2024-03-18 NOTE — Progress Notes (Signed)
 NEPHROLOGY, [facility]  [address]  [place] [address]    Progress Note    Name: Matthew Sparks MRN:  [id number]   Date: 03/18/2024 DOB:  Jan 13, 1956 (68 y.o.)              Nephrology Out  Patient Visit       HPI : 68 y.o.   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 Past medical history chronic kidney disease, hypertension, insulin-dependent diabetes mellitus type II, and hyperlipidemia.   Remains on Farxiga . Denies hematuria and dysuria.  Blood pressure is at goal.  Hemoglobin A1c less than 7.  Currently taking chlorthalidone  25 mg daily.  Reports no issues with edema, no edema noted today.  He is requesting a referral for left shoulder pain. MRI was done. He is requesting Dr. Cecilie.  Will send referral.       ROS:      Assessment  was negative except what mentioned in in the HPI.       OBJECTIVE:   There were no vitals taken for this visit.      General:  NAD, AAOx3  HEENT:  EOMI,  no icterus  NECK: No increased JVD.  Normal inspection   HEART:   Regular rhythm. No murmurs or rubs. No chest wall tenderness   LUNGS: Clear to auscultation bilateral. No wheezes, rales, or rhonchi.   ABDOMEN: +BS, Soft, nontender and nondistended. No rebound or guarding present.   EXTREMITIES: No edema. No cyanosis or clubbing.No asterixis    NEURO : Normal speech, EOMI.       LABORATORY DATA:   Lab Results   Component Value Date    BUN 34 (H) 03/11/2024    BUN 44 (H) 12/31/2023    BUN 46 (H) 12/10/2023    CREATININE 1.90 (H) 03/11/2024    CREATININE 2.47 (H) 12/31/2023    CREATININE 2.59 (H) 12/10/2023    BUNCRRATIO 18 03/11/2024    BUNCRRATIO 18 12/31/2023    BUNCRRATIO 18 12/10/2023    GFR 37 (L) 03/11/2024    GFR 28 (L) 12/31/2023    GFR 26 (L) 12/10/2023    SODIUM 141 03/11/2024    SODIUM 141 12/31/2023    SODIUM 140 12/10/2023    POTASSIUM 3.7 03/11/2024    POTASSIUM 3.6 12/31/2023    POTASSIUM 4.1 12/10/2023    CHLORIDE 109 (H) 03/11/2024    CHLORIDE 114  (H) 12/31/2023    CHLORIDE 108 (H) 12/10/2023    CO2 23 03/11/2024    CO2 18 (L) 12/31/2023    CO2 23 12/10/2023    ANIONGAP 9 03/11/2024    ANIONGAP 9 12/31/2023    ANIONGAP 9 12/10/2023    CALCIUM 9.9 03/11/2024    CALCIUM 10.3 12/31/2023    CALCIUM 9.6 12/10/2023    PHOSPHORUS 5.0 12/10/2023    PHOSPHORUS 3.4 (L) 10/08/2022    PHOSPHORUS 3.8 03/27/2022    ALBUMIN 4.6 12/31/2023    ALBUMIN 4.1 12/10/2023    ALBUMIN 4.5 10/09/2022    HGB 11.6 (L) 03/11/2024    HGB 11.8 (L) 12/31/2023    HGB 12.8 12/10/2023    HCT 34.5 (L) 03/11/2024    HCT 35.2 (L) 12/31/2023    HCT 38.5 12/10/2023    INTACTPTH 25.6 03/11/2024    INTACTPTH 56.0 12/10/2023    INTACTPTH 31.0 09/10/2023    IRON 112 10/08/2022    IRONBINDCAP 424 10/08/2022  IRONSAT 26 10/08/2022           MEDICATIONS:  No outpatient medications have been marked as taking for the 03/18/24 encounter (Office Visit) with Cathryne Iha, NP.     Current Outpatient Medications   Medication Instructions    albuterol (PROVENTIL) 2.5 mg, 3 TIMES DAILY    albuterol sulfate (PROVENTIL OR VENTOLIN OR PROAIR) 90 mcg/actuation Inhalation HFA Aerosol Inhaler 1-2 Puffs, EVERY 6 HOURS PRN    allopurinoL  (ZYLOPRIM ) 100 mg, Oral, Daily    amLODIPine  (NORVASC) 10 mg, Oral, Daily    aspirin  81 mg, Daily    atorvastatin  (LIPITOR) 80 mg, Oral, EVERY EVENING    chlorTHALIDONE  (HYGROTON ) 25 mg, Oral, Daily    clopidogreL  (PLAVIX ) 75 mg, Oral, Daily    DEXCOM G7 SENSOR Does not apply Device REPLACE SENSOR EVERY 10 DAYS    ergocalciferol  (vitamin D2) (DRISDOL ) 50,000 Units, Oral, EVERY 7 DAYS    glimepiride (AMARYL) 2 mg, 2 TIMES DAILY    ipratropium-albuterol 0.5 mg-3 mg(2.5 mg base)/3 mL Solution for Nebulization 3 mL    isosorbide mononitrate (IMDUR) 30 mg, Daily    losartan (COZAAR) 25 mg, Daily    metoprolol  succinate (TOPROL -XL) 100 mg, Oral, Daily    metoprolol  succinate (TOPROL -XL) 50 mg, Daily    multivitamin Oral Tablet 1 Tablet, Daily    nitroGLYCERIN (NITROSTAT) 0.3 mg, EVERY 5  MIN PRN    omega 3-dha-epa-fish oil 183.3 mg-75 mg -91.6 mg-306 mg Oral Capsule 1 Capsule, Daily    potassium chloride  (K-DUR) 10 mEq Oral Tab Sust.Rel. Particle/Crystal 10 mEq, Oral, Daily       ASSESSMENT / PLAN:   No diagnosis found.             Chronic kidney disease  -Stage IV  -Due to diabetes and hypertension  -Creatinine is 2.59 /GFR 26  -Baseline creatinine :  1.92  -Total protein to creatinine ratio:  improved  0.13 from 1.09  -CBC and a basic metabolic panel  -Return to clinic in 3 months  -Continue low-sodium diet  -balanced Fluid intake 50  oz a day.    -Avoid NSAIDs.  Tylenol  only for pain  -Renal imaging: No hydronephrosis no cysts or large masses  -ACEI or ARBS:  Losartan 25 mg daily  -Sodium Glucose Cotransporter-2 (SGLT2) Inhibitors: Farxiga  10 mg daily           Proteinuria  -negative   -serology          Hypertension  -Blood pressure is acceptable  -Goal less than 130/80  -Continue losartan 25 mg daily  -continue amlodipine  10 mg daily  -chlorthalidone  50 mg daily  -Low-salt diet        Diabetes type 2  -A1c goal is less than 7   Lab Results   Component Value Date    HA1C 7.6 (H) 11/03/2023      -Continue Farxiga  10 mg daily  -continue sliding scale insulin  -Diabetic diet  -Increase activity and exercise as possible  -Monitor A1C        Hyperuricemia  -Check uric acid   Lab Results   Component Value Date    URICACID 8.4 (H) 03/11/2024       -Low uric acid diet  -allopurinol  100 mg daily         Vitamin D  deficiency  -vitamin-D level acceptable   -continue to monitor vitamin-D level        Referral to ortho left shoulder pain  -Dr. Cecilie  I have discussed with the patient the following issues:  The main goal of treatment is to prevent progression of CKD to complete kidney failure. The best way to do this is to control the underlying cause.  the optimal diet is one similar to the Dietary Approaches to Stop Hypertension (DASH) diet, consisting of fruits, vegetables, legumes, fish,  poultry, and whole grains.  Restrict sodium intake to less than 2 g/day        No orders of the defined types were placed in this encounter.      Suzen Risser, NP

## 2024-03-22 NOTE — Addendum Note (Signed)
 Addended by: CATHRYNE IHA D on: 03/22/2024 03:53 PM     Modules accepted: Orders

## 2024-04-07 ENCOUNTER — Ambulatory Visit (HOSPITAL_COMMUNITY): Payer: Self-pay

## 2024-04-07 ENCOUNTER — Ambulatory Visit (HOSPITAL_COMMUNITY)

## 2024-04-08 ENCOUNTER — Ambulatory Visit (HOSPITAL_COMMUNITY): Payer: Self-pay

## 2024-04-08 ENCOUNTER — Ambulatory Visit (HOSPITAL_COMMUNITY)

## 2024-04-29 ENCOUNTER — Encounter (INDEPENDENT_AMBULATORY_CARE_PROVIDER_SITE_OTHER): Payer: Self-pay

## 2024-05-17 ENCOUNTER — Other Ambulatory Visit: Payer: Self-pay

## 2024-05-17 ENCOUNTER — Emergency Department (HOSPITAL_BASED_OUTPATIENT_CLINIC_OR_DEPARTMENT_OTHER)

## 2024-05-17 ENCOUNTER — Encounter (HOSPITAL_BASED_OUTPATIENT_CLINIC_OR_DEPARTMENT_OTHER): Payer: Self-pay

## 2024-05-17 ENCOUNTER — Emergency Department
Admission: EM | Admit: 2024-05-17 | Discharge: 2024-05-17 | Disposition: A | Discharge status: Home / Self Care | Attending: Emergency Medicine | Admitting: Emergency Medicine

## 2024-05-17 DIAGNOSIS — Z8673 Personal history of transient ischemic attack (TIA), and cerebral infarction without residual deficits: Secondary | ICD-10-CM | POA: Insufficient documentation

## 2024-05-17 DIAGNOSIS — F17200 Nicotine dependence, unspecified, uncomplicated: Secondary | ICD-10-CM | POA: Insufficient documentation

## 2024-05-17 DIAGNOSIS — M19012 Primary osteoarthritis, left shoulder: Secondary | ICD-10-CM | POA: Insufficient documentation

## 2024-05-17 DIAGNOSIS — R0789 Other chest pain: Secondary | ICD-10-CM

## 2024-05-17 DIAGNOSIS — R9431 Abnormal electrocardiogram [ECG] [EKG]: Secondary | ICD-10-CM | POA: Insufficient documentation

## 2024-05-17 DIAGNOSIS — I10 Essential (primary) hypertension: Secondary | ICD-10-CM | POA: Insufficient documentation

## 2024-05-17 DIAGNOSIS — E7800 Pure hypercholesterolemia, unspecified: Secondary | ICD-10-CM | POA: Insufficient documentation

## 2024-05-17 DIAGNOSIS — R079 Chest pain, unspecified: Secondary | ICD-10-CM | POA: Insufficient documentation

## 2024-05-17 DIAGNOSIS — E119 Type 2 diabetes mellitus without complications: Secondary | ICD-10-CM | POA: Insufficient documentation

## 2024-05-17 LAB — CBC WITH DIFF
BASOPHIL #: 0.01 x10ˆ3/uL (ref 0.00–0.10)
BASOPHIL %: 0 % (ref 0–1)
EOSINOPHIL #: 0.4 x10ˆ3/uL (ref 0.00–0.60)
EOSINOPHIL %: 5 % (ref 1–8)
HCT: 37.3 % (ref 36.7–47.1)
HGB: 12.6 g/dL (ref 12.5–16.3)
LYMPHOCYTE #: 1.89 x10ˆ3/uL (ref 1.00–3.00)
LYMPHOCYTE %: 24 % (ref 15–43)
MCH: 31.7 pg (ref 23.8–33.4)
MCHC: 33.9 g/dL (ref 32.5–36.3)
MCV: 93.5 fL (ref 73.0–96.2)
MONOCYTE #: 0.67 x10ˆ3/uL (ref 0.30–1.00)
MONOCYTE %: 8 % (ref 6–14)
MPV: 10.6 fL (ref 7.4–11.4)
NEUTROPHIL #: 4.99 x10ˆ3/uL (ref 1.85–7.84)
NEUTROPHIL %: 63 % (ref 44–74)
PLATELETS: 145 x10ˆ3/uL (ref 140–440)
RBC: 3.99 x10ˆ6/uL — ABNORMAL LOW (ref 4.06–5.63)
RDW: 13.3 % (ref 12.1–16.2)
WBC: 8 x10ˆ3/uL (ref 3.6–10.2)

## 2024-05-17 LAB — URINALYSIS, MACRO/MICRO
BILIRUBIN: NEGATIVE mg/dL
GLUCOSE: 250 mg/dL — AB
KETONES: NEGATIVE mg/dL
LEUKOCYTES: NEGATIVE WBCs/uL
NITRITE: NEGATIVE
PH: 6 (ref 4.6–8.0)
PROTEIN: 100 mg/dL — AB
SPECIFIC GRAVITY: 1.025 (ref 1.003–1.035)
UROBILINOGEN: 0.2 mg/dL (ref 0.2–1.0)

## 2024-05-17 LAB — TROPONIN-I
TROPONIN I: 14 ng/L (ref <20)
TROPONIN I: 14 ng/L (ref <20)
TROPONIN I: 15 ng/L (ref <20)

## 2024-05-17 LAB — COMPREHENSIVE METABOLIC PANEL, NON-FASTING
ALBUMIN/GLOBULIN RATIO: 1.1 (ref 0.8–1.4)
ALBUMIN: 4.1 g/dL (ref 3.4–5.0)
ALKALINE PHOSPHATASE: 89 U/L (ref 46–116)
ALT (SGPT): 82 U/L — ABNORMAL HIGH (ref <=78)
ANION GAP: 12 mmol/L (ref 4–13)
AST (SGOT): 39 U/L — ABNORMAL HIGH (ref 15–37)
BILIRUBIN TOTAL: 0.4 mg/dL (ref 0.2–1.0)
BUN/CREA RATIO: 16
BUN: 32 mg/dL — ABNORMAL HIGH (ref 7–18)
CALCIUM, CORRECTED: 9 mg/dL
CALCIUM: 9.1 mg/dL (ref 8.5–10.1)
CHLORIDE: 108 mmol/L — ABNORMAL HIGH (ref 98–107)
CO2 TOTAL: 23 mmol/L (ref 21–32)
CREATININE: 1.97 mg/dL — ABNORMAL HIGH (ref 0.70–1.30)
ESTIMATED GFR: 36 mL/min/1.73mˆ2 — ABNORMAL LOW (ref >59)
GLOBULIN: 3.8
GLUCOSE: 127 mg/dL — ABNORMAL HIGH (ref 74–106)
OSMOLALITY, CALCULATED: 294 mosm/kg — ABNORMAL HIGH (ref 270–290)
POTASSIUM: 3.7 mmol/L (ref 3.5–5.1)
PROTEIN TOTAL: 7.9 g/dL (ref 6.4–8.2)
SODIUM: 143 mmol/L (ref 136–145)

## 2024-05-17 LAB — ECG 12 LEAD
Atrial Rate: 67 {beats}/min
Calculated P Axis: 60 degrees
Calculated R Axis: 54 degrees
Calculated T Axis: 26 degrees
PR Interval: 160 ms
QRS Duration: 104 ms
QT Interval: 406 ms
QTC Calculation: 429 ms
Ventricular rate: 67 {beats}/min

## 2024-05-17 LAB — PT/INR
INR: 0.94 (ref 0.84–1.10)
PROTHROMBIN TIME: 10.6 s (ref 9.8–12.7)

## 2024-05-17 LAB — URINALYSIS, MICROSCOPIC

## 2024-05-17 LAB — MAGNESIUM: MAGNESIUM: 2 mg/dL (ref 1.8–2.4)

## 2024-05-17 LAB — PTT (PARTIAL THROMBOPLASTIN TIME): APTT: 22.5 s — ABNORMAL LOW (ref 25.0–38.0)

## 2024-05-17 LAB — D-DIMER: D-DIMER: 412 ng{FEU}/mL (ref 215–500)

## 2024-05-17 MED ORDER — NITROGLYCERIN 2 % TRANSDERMAL OINTMENT - PACKET
TOPICAL_OINTMENT | TRANSDERMAL | Status: AC
Start: 2024-05-17 — End: 2024-05-17
  Filled 2024-05-17: qty 1

## 2024-05-17 MED ORDER — NITROGLYCERIN 0.4 MG SUBLINGUAL TABLET
SUBLINGUAL_TABLET | SUBLINGUAL | Status: AC
Start: 2024-05-17 — End: 2024-05-17
  Filled 2024-05-17: qty 1

## 2024-05-17 MED ORDER — METHYLPREDNISOLONE ACETATE 80 MG/ML SUSPENSION FOR INJECTION
80.0000 mg | Freq: Once | INTRAMUSCULAR | Status: AC
Start: 2024-05-17 — End: 2024-05-17
  Administered 2024-05-17: 80 mg via INTRAMUSCULAR

## 2024-05-17 MED ORDER — ASPIRIN 81 MG CHEWABLE TABLET
CHEWABLE_TABLET | ORAL | Status: AC
Start: 2024-05-17 — End: 2024-05-17
  Filled 2024-05-17: qty 4

## 2024-05-17 MED ORDER — KETOROLAC 30 MG/ML (1 ML) INJECTION SOLUTION
15.0000 mg | INTRAMUSCULAR | Status: AC
Start: 2024-05-17 — End: 2024-05-17
  Administered 2024-05-17: 15 mg via INTRAVENOUS

## 2024-05-17 MED ORDER — CYCLOBENZAPRINE 10 MG TABLET: 10.0000 mg | ORAL_TABLET | Freq: Three times a day (TID) | ORAL | 0 refills | Status: DC | PRN

## 2024-05-17 MED ORDER — TRAMADOL 50 MG TABLET
1.0000 | ORAL_TABLET | Freq: Four times a day (QID) | ORAL | 0 refills | Status: AC | PRN
Start: 2024-05-17 — End: 2024-05-27

## 2024-05-17 MED ORDER — METHYLPREDNISOLONE ACETATE 80 MG/ML SUSPENSION FOR INJECTION
INTRAMUSCULAR | Status: AC
Start: 2024-05-17 — End: 2024-05-17
  Filled 2024-05-17: qty 1

## 2024-05-17 MED ORDER — ASPIRIN 81 MG CHEWABLE TABLET
324.0000 mg | CHEWABLE_TABLET | ORAL | Status: AC
Start: 2024-05-17 — End: 2024-05-17
  Administered 2024-05-17: 324 mg via ORAL

## 2024-05-17 MED ORDER — NITROGLYCERIN 0.4 MG SUBLINGUAL TABLET
0.4000 mg | SUBLINGUAL_TABLET | SUBLINGUAL | Status: AC
Start: 2024-05-17 — End: 2024-05-17
  Administered 2024-05-17: 0.4 mg via SUBLINGUAL

## 2024-05-17 MED ORDER — NITROGLYCERIN 2 % TRANSDERMAL OINTMENT - PACKET
1.0000 [in_us] | TOPICAL_OINTMENT | TRANSDERMAL | Status: AC
Start: 2024-05-17 — End: 2024-05-17
  Administered 2024-05-17: 1 [in_us] via TOPICAL

## 2024-05-17 MED ORDER — KETOROLAC 30 MG/ML (1 ML) INJECTION SOLUTION
INTRAMUSCULAR | Status: AC
Start: 2024-05-17 — End: 2024-05-17
  Filled 2024-05-17: qty 1

## 2024-05-17 MED ORDER — SODIUM CHLORIDE 0.9 % IV BOLUS
500.0000 mL | INJECTION | Status: AC
Start: 2024-05-17 — End: 2024-05-17
  Administered 2024-05-17: 0 mL via INTRAVENOUS
  Administered 2024-05-17: 500 mL via INTRAVENOUS

## 2024-05-17 NOTE — ED Nurses Note (Signed)
 Patients chest pain is better but he still complains of left shoulder pain from a rotator cuff tear that he has re-injured. Dr Eilene informed.

## 2024-05-17 NOTE — ED Nurses Note (Signed)
 Patient discharged home all instructions gone over with patient including medications with opportunity to ask questions. Patient provided number to [facility] [place]. Patient verbalized understanding and ambulated off unit independently with spouse.

## 2024-05-17 NOTE — ED Nurses Note (Signed)
 Patients chest pain after 1 nitro was (6)  Chest pain after 2nd nitro was (4)  After 3rd nitro chest pain is now (0)

## 2024-05-17 NOTE — ED Nurses Note (Signed)
 Patient and wife provided sandwich trays and diet coke. Aware we are waiting for the 3rd troponin to be drawn and result. No other needs at this time.

## 2024-05-17 NOTE — ED Provider Notes (Signed)
 [facility] [place], [facility] - Emergency Department  ED Primary Provider Note  History of Present Illness   Chief Complaint   Patient presents with    Chest Pain      2 1/2 mos ago was working in yard using a chain saw and felt pain in left chest into left arm. Pt states has had pain left anterior chest down left arm every day since. Pt has shortness of breath while walking back to room     Matthew Sparks is a 68 y.o. male who had concerns including Chest Pain .  Arrival: The patient arrived by Car complaining left-sided chest wall pain along with pain radiating into his left shoulder.  Patient has had rotator cuff tear in the left shoulder and states that he always has pain in the shoulder itself.  He can not tell whether it is the chest pain radiating there or whether it is his injury.  He did lift the chainsaw this morning in his backyard cutting hedges and he stated he immediately got sharp pain in his chest as well as his left arm.  Patient is short of breath with the pain.  Patient states having high blood pressure, diabetes, high cholesterol.  He denies any tobacco although his history states he occasionally smokes.  Also drinks alcohol.  Patient states having mini strokes leaving his left side weak over time.  He states his last stroke was 2015.  He denies any stents or MIs in the past.      HPI  Review of Systems   Review of Systems   Constitutional:  Positive for activity change and appetite change. Negative for chills and fever.   HENT:  Negative for ear pain and sore throat.    Eyes:  Negative for pain and visual disturbance.   Respiratory:  Positive for shortness of breath. Negative for cough.    Cardiovascular:  Positive for chest pain. Negative for palpitations.   Gastrointestinal:  Negative for abdominal pain and vomiting.   Genitourinary:  Negative for dysuria and hematuria.   Musculoskeletal:  Negative for arthralgias and back pain.   Skin:  Negative for color change and rash.    Neurological:  Negative for seizures and syncope.   All other systems reviewed and are negative.     Historical Data   History Reviewed This Encounter:     Physical Exam   ED Triage Vitals [05/17/24 1105]   BP (Non-Invasive) (!) 192/83   Heart Rate 63   Respiratory Rate (!) 24   Temperature 36.8 C (98.2 F)   SpO2 100 %   Weight 90.7 kg (200 lb)   Height 1.778 m (5' 10)     Physical Exam  Vitals and nursing note reviewed.   Constitutional:       General: He is not in acute distress.     Appearance: Normal appearance. He is well-developed. He is obese.   HENT:      Head: Normocephalic and atraumatic.      Right Ear: External ear normal.      Left Ear: External ear normal.      Nose: Nose normal.      Mouth/Throat:      Mouth: Mucous membranes are dry.   Eyes:      Extraocular Movements: Extraocular movements intact.      Conjunctiva/sclera: Conjunctivae normal.      Pupils: Pupils are equal, round, and reactive to light.   Cardiovascular:  Rate and Rhythm: Normal rate and regular rhythm.      Pulses: Normal pulses.      Heart sounds: Normal heart sounds. No murmur heard.  Pulmonary:      Effort: Pulmonary effort is normal. No respiratory distress.      Breath sounds: Normal breath sounds.   Abdominal:      General: Bowel sounds are normal.      Palpations: Abdomen is soft.      Tenderness: There is no abdominal tenderness.   Musculoskeletal:         General: No swelling. Normal range of motion.      Cervical back: Normal range of motion and neck supple.   Skin:     General: Skin is warm and dry.      Capillary Refill: Capillary refill takes less than 2 seconds.   Neurological:      General: No focal deficit present.      Mental Status: He is alert and oriented to person, place, and time.   Psychiatric:         Mood and Affect: Mood normal.         Behavior: Behavior normal.         Thought Content: Thought content normal.       Patient Data     Labs Ordered/Reviewed   COMPREHENSIVE METABOLIC PANEL,  NON-FASTING - Abnormal; Notable for the following components:       Result Value    CHLORIDE 108 (*)     BUN 32 (*)     CREATININE 1.97 (*)     ESTIMATED GFR 36 (*)     GLUCOSE 127 (*)     ALT (SGPT) 82 (*)     AST (SGOT) 39 (*)     OSMOLALITY, CALCULATED 294 (*)     All other components within normal limits    Narrative:     Estimated Glomerular Filtration Rate (eGFR) is calculated using the CKD-EPI (2021) equation, intended for patients 66 years of age and older. If gender is not documented or unknown, there will be no eGFR calculation.   PTT (PARTIAL THROMBOPLASTIN TIME) - Abnormal; Notable for the following components:    APTT 22.5 (*)     All other components within normal limits   CBC WITH DIFF - Abnormal; Notable for the following components:    RBC 3.99 (*)     All other components within normal limits   URINALYSIS, MACRO/MICRO - Abnormal; Notable for the following components:    APPEARANCE Slightly Hazy (*)     PROTEIN 100 (*)     GLUCOSE 250 (*)     BLOOD Trace (*)     All other components within normal limits   URINALYSIS, MICROSCOPIC - Abnormal; Notable for the following components:    RBCS 3-5 (*)     BACTERIA Few (*)     All other components within normal limits   PT/INR - Normal    Narrative:     In the setting of warfarin therapy, a moderate-intensity INR goal range is 2.0 to 3.0 and a high-intensity INR goal range is 2.5 to 3.5.    INR is ONLY validated to determine the level of anticoagulation with vitamin K antagonists (warfarin). Other factors may elevate the INR including but not limited to direct oral anticoagulants (DOACs), liver dysfunction, vitamin K deficiency, DIC, factor deficiencies, and factor inhibitors.   TROPONIN-I - Normal    Narrative:     Values received  on females ranging between 12-15 ng/L MUST include the next serial troponin to review changes in the delta differences as the reference range for the Access II chemistry analyzer is lower than the established reference range.      TROPONIN-I - Normal    Narrative:     Values received on females ranging between 12-15 ng/L MUST include the next serial troponin to review changes in the delta differences as the reference range for the Access II chemistry analyzer is lower than the established reference range.     TROPONIN-I - Normal    Narrative:     Values received on females ranging between 12-15 ng/L MUST include the next serial troponin to review changes in the delta differences as the reference range for the Access II chemistry analyzer is lower than the established reference range.     D-DIMER - Normal    Narrative:     D-Dimers are reported in FEU per ng/mL.    IF PATIENT IS EXHIBITING SYMPTOMS DVT/PE, THIS D-DIMER RESULT MAY INDICATE A NEED FOR FURTHER TESTING FOR THESE CONDITIONS. IF PATIENT IS SUSPECTED OF DIC AND SYMPTOMS WORSEN OR PERSIST, A REPEAT DIC WORKUP SHOULD BE CONSIDERED.    NOTE: ALTHOUGH THE NORMAL RANGE FOR THIS TEST IS 215-500 ng/mL FEU, LITERATURE RECOMMENDS FURTHER TESTING FOR ANY RESULT >500ng/mL FEU.    A cut off value of 500ng/mL FEU or below can be used as an aid in the diagnosis of Thromboembolism when used in conjunction with the patient's medical history, clinical presentation and other findings. Results of 500ng/mL FEU or below have a negative predictive value of 100%.     MAGNESIUM  - Normal   CBC/DIFF    Narrative:     The following orders were created for panel order CBC/DIFF.  Procedure                               Abnormality         Status                     ---------                               -----------         ------                     CBC WITH IPQQ[[id number]]                Abnormal            Final result                 Please view results for these tests on the individual orders.   URINALYSIS WITH REFLEX MICROSCOPIC AND CULTURE IF POSITIVE    Narrative:     The following orders were created for panel order URINALYSIS WITH REFLEX MICROSCOPIC AND CULTURE IF POSITIVE.  Procedure                                Abnormality         Status                     ---------                               -----------         ------  URINALYSIS, MACRO/MICRO[[id number]]      Abnormal            Final result               URINALYSIS, MICROSCOPIC[[id number]]      Abnormal            Final result                 Please view results for these tests on the individual orders.     XR AP MOBILE CHEST   Final Result by Edi, [facility] In (11/25 1118)   No focal alveolar infiltrate is identified.         Radiologist location ID: [id number]           Medical Decision Making        Medical Decision Making  Patient is 68 year old black male complaining of working in his yd and went to lift chainsaw and suddenly got severe pain in his left side of his chest as well as his left arm.  Patient has had rotator cuff tear in the arm in he stated he just forgot it was there.  When he lifted the chainsaw he immediately got severe pain.  Patient was also short of breath with the chest pain.  Denies having any chest pain prior to today.  He denies any stents or MIs in the past.  He does have risk factors of hypertension, diabetes, high cholesterol.  He denied smoking but he occasionally smokes.  He also drinks alcohol.  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  Patient will have an IV placed along with fluids and labs.  Patient will also have chest x-ray.  He will have serial troponins x3.  Patient will then be treated for results of his testing.  He will then if his troponins are negative be discharged home.  Patient will follow up with his primary care physician in the next 2 or 3 days.    Amount and/or Complexity of Data Reviewed  Labs: ordered.  Radiology: ordered.  ECG/medicine tests: ordered.     Details: Normal sinus rhythm 67, PR interval 160 MS, QT interval 406 MS, flat T-waves 2, 3, AVF, V4 V5 and V6.    Risk  OTC drugs.  Prescription drug management.             Medications Ordered/Administered in the ED    aspirin  chewable tablet 324 mg (324 mg Oral Given 05/17/24 1112)   NS bolus infusion 500 mL (0 mL Intravenous Stopped 05/17/24 1212)   nitroGLYCERIN  (NITROSTAT ) sublingual tablet (0.4 mg Sublingual Given 05/17/24 1107)   nitroGLYCERIN  (NITRO-BID ) 2 % topical ointment (1 Inch Apply Topically Given 05/17/24 1107)   methylPREDNISolone  acetate (DEPO-medrol ) 80 mg/mL injection (80 mg IntraMUSCULAR Given 05/17/24 1401)   ketorolac  (TORADOL ) 30 mg/mL injection (15 mg Intravenous Given 05/17/24 1358)     Clinical Impression   Chest pain at rest (Primary)   Degenerative joint disease, shoulder, left       Disposition: Discharged               Clinical Impression   Chest pain at rest (Primary)   Degenerative joint disease, shoulder, left       Current Discharge Medication List        START taking these medications    Details   cyclobenzaprine  (FLEXERIL ) 10 mg Oral Tablet Take 1 Tablet (10 mg total) by mouth Three times a day as needed for Muscle spasms  Qty: 12 Tablet, Refills: 0      traMADoL  (ULTRAM ) 50 mg Oral Tablet Take 1 Tablet (50 mg total) by mouth Every 6 hours as needed for Pain for up to 10 days  Qty: 20 Tablet, Refills: 0

## 2024-05-26 ENCOUNTER — Ambulatory Visit

## 2024-05-26 ENCOUNTER — Other Ambulatory Visit: Payer: Self-pay

## 2024-05-26 ENCOUNTER — Encounter (HOSPITAL_BASED_OUTPATIENT_CLINIC_OR_DEPARTMENT_OTHER): Payer: Self-pay

## 2024-05-26 ENCOUNTER — Other Ambulatory Visit (INDEPENDENT_AMBULATORY_CARE_PROVIDER_SITE_OTHER): Payer: Self-pay

## 2024-05-26 VITALS — BP 145/67 | HR 60 | Temp 98.6°F | Resp 19 | Ht 70.0 in | Wt 211.2 lb

## 2024-05-26 DIAGNOSIS — N1831 Chronic kidney disease, stage 3a: Secondary | ICD-10-CM

## 2024-05-26 DIAGNOSIS — E1122 Type 2 diabetes mellitus with diabetic chronic kidney disease: Secondary | ICD-10-CM

## 2024-05-26 LAB — POCT HGB A1C: POCT HGB A1C: 6.4 % — AB (ref 4–6)

## 2024-05-26 MED ORDER — DEXCOM G7 SENSOR DEVICE: 1.0000 | 3 refills | Status: AC

## 2024-05-26 MED ORDER — DULAGLUTIDE 0.75 MG/0.5 ML SUBCUTANEOUS PEN INJECTOR: 0.7500 mg | PEN_INJECTOR | SUBCUTANEOUS | 0 refills | Status: DC

## 2024-05-26 MED ORDER — INSULIN GLARGINE (U-300) CONC. 300 UNIT/ML (3 ML) SUBCUTANEOUS PEN: 50.0000 [IU] | PEN_INJECTOR | Freq: Two times a day (BID) | SUBCUTANEOUS | 0 refills | Status: DC

## 2024-05-26 MED ORDER — BLOOD-GLUCOSE METER: 0 refills | Status: AC

## 2024-05-26 MED ORDER — LANCETS: 2 refills | Status: AC

## 2024-05-26 MED ORDER — BLOOD GLUCOSE TEST STRIPS: ORAL_STRIP | 2 refills | Status: AC
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
## 2024-05-26 NOTE — Patient Instructions (Addendum)
 Stop the Glimepiride     Continue the Toujeo  50 units twice a day and the farxiga  10 mg daily    Start trulicity  0.75mg  weekly

## 2024-05-27 ENCOUNTER — Other Ambulatory Visit (INDEPENDENT_AMBULATORY_CARE_PROVIDER_SITE_OTHER): Payer: Self-pay

## 2024-05-27 DIAGNOSIS — N1831 Chronic kidney disease, stage 3a: Secondary | ICD-10-CM

## 2024-05-31 ENCOUNTER — Other Ambulatory Visit (INDEPENDENT_AMBULATORY_CARE_PROVIDER_SITE_OTHER): Payer: Self-pay

## 2024-05-31 LAB — PARACHUTE DME

## 2024-05-31 MED ORDER — INSULIN GLARGINE (U-300) CONC. 300 UNIT/ML (3 ML) SUBCUTANEOUS PEN: 50.0000 [IU] | PEN_INJECTOR | Freq: Two times a day (BID) | SUBCUTANEOUS | 0 refills | Status: AC

## 2024-06-03 ENCOUNTER — Ambulatory Visit (INDEPENDENT_AMBULATORY_CARE_PROVIDER_SITE_OTHER): Payer: Self-pay

## 2024-06-03 ENCOUNTER — Other Ambulatory Visit: Payer: Self-pay

## 2024-06-03 ENCOUNTER — Other Ambulatory Visit

## 2024-06-03 DIAGNOSIS — E559 Vitamin D deficiency, unspecified: Secondary | ICD-10-CM

## 2024-06-03 DIAGNOSIS — N1832 Chronic kidney disease, stage 3b: Secondary | ICD-10-CM | POA: Insufficient documentation

## 2024-06-03 DIAGNOSIS — E119 Type 2 diabetes mellitus without complications: Secondary | ICD-10-CM

## 2024-06-03 LAB — PROTEIN/CREATININE RATIO, URINE, RANDOM
CREATININE RANDOM URINE: 133 mg/dL — ABNORMAL HIGH (ref 30–125)
PROTEIN RANDOM URINE: 117 mg/dL — ABNORMAL HIGH (ref 50–80)
PROTEIN/CREATININE RATIO RANDOM URINE: 0.88 mg/mg (ref 0.000–200.000)

## 2024-06-03 LAB — CBC WITH DIFF
BASOPHIL #: 0.1 x10ˆ3/uL (ref 0.00–0.10)
BASOPHIL %: 1 % (ref 0–1)
EOSINOPHIL #: 0.4 x10ˆ3/uL (ref 0.00–0.60)
EOSINOPHIL %: 4 % (ref 1–8)
HCT: 40.4 % (ref 36.7–47.1)
HGB: 13.2 g/dL (ref 12.5–16.3)
LYMPHOCYTE #: 2 x10ˆ3/uL (ref 1.00–3.00)
LYMPHOCYTE %: 23 % (ref 15–43)
MCH: 29.6 pg (ref 23.8–33.4)
MCHC: 32.7 g/dL (ref 32.5–36.3)
MCV: 90.6 fL (ref 73.0–96.2)
MONOCYTE #: 0.8 x10ˆ3/uL (ref 0.30–1.10)
MONOCYTE %: 9 % (ref 6–14)
MPV: 11.2 fL (ref 7.4–11.4)
NEUTROPHIL #: 5.5 x10ˆ3/uL (ref 1.85–7.84)
NEUTROPHIL %: 63 % (ref 44–74)
PLATELETS: 174 x10ˆ3/uL (ref 140–440)
RBC: 4.46 x10ˆ6/uL (ref 4.06–5.63)
RDW: 13.5 % (ref 12.1–16.2)
WBC: 8.8 x10ˆ3/uL (ref 3.6–10.2)

## 2024-06-03 LAB — MAGNESIUM: MAGNESIUM: 2 mg/dL (ref 1.9–2.7)

## 2024-06-03 LAB — VITAMIN D 25 TOTAL: VITAMIN D 25, TOTAL: 41.07 ng/mL (ref 30.00–100.00)

## 2024-06-03 LAB — BASIC METABOLIC PANEL
ANION GAP: 7 mmol/L (ref 4–13)
BUN/CREA RATIO: 18 (ref 6–22)
BUN: 36 mg/dL — ABNORMAL HIGH (ref 7–25)
CALCIUM: 9.7 mg/dL (ref 8.6–10.3)
CHLORIDE: 113 mmol/L — ABNORMAL HIGH (ref 98–107)
CO2 TOTAL: 22 mmol/L (ref 21–31)
CREATININE: 2.02 mg/dL — ABNORMAL HIGH (ref 0.60–1.30)
ESTIMATED GFR: 35 mL/min/1.73mˆ2 — ABNORMAL LOW (ref >59)
GLUCOSE: 109 mg/dL (ref 74–109)
OSMOLALITY, CALCULATED: 292 mosm/kg — ABNORMAL HIGH (ref 270–290)
POTASSIUM: 3.7 mmol/L (ref 3.5–5.1)
SODIUM: 142 mmol/L (ref 136–145)

## 2024-06-03 LAB — MICROALBUMIN/CREATININE RATIO, URINE, RANDOM
CREATININE RANDOM URINE: 133 mg/dL — ABNORMAL HIGH (ref 30–125)
MICROALBUMIN RANDOM URINE: 77.3 mg/dL
MICROALBUMIN/CREATININE RATIO RANDOM URINE: 581.2 mg/g

## 2024-06-03 LAB — URIC ACID: URIC ACID: 8.4 mg/dL — ABNORMAL HIGH (ref 2.3–7.6)

## 2024-06-03 LAB — HGA1C (HEMOGLOBIN A1C WITH EST AVG GLUCOSE): HEMOGLOBIN A1C: 7.2 % — ABNORMAL HIGH (ref 4.0–6.0)

## 2024-06-03 LAB — PARATHYROID HORMONE (PTH): PTH: 33.7 pg/mL (ref 12.0–88.0)

## 2024-06-10 ENCOUNTER — Encounter (INDEPENDENT_AMBULATORY_CARE_PROVIDER_SITE_OTHER): Payer: Self-pay

## 2024-06-10 ENCOUNTER — Ambulatory Visit (INDEPENDENT_AMBULATORY_CARE_PROVIDER_SITE_OTHER): Payer: Self-pay

## 2024-06-10 ENCOUNTER — Other Ambulatory Visit: Payer: Self-pay

## 2024-06-10 VITALS — BP 123/55 | HR 67 | Ht 70.0 in | Wt 203.0 lb

## 2024-06-10 DIAGNOSIS — E790 Hyperuricemia without signs of inflammatory arthritis and tophaceous disease: Secondary | ICD-10-CM

## 2024-06-10 DIAGNOSIS — I129 Hypertensive chronic kidney disease with stage 1 through stage 4 chronic kidney disease, or unspecified chronic kidney disease: Secondary | ICD-10-CM

## 2024-06-10 DIAGNOSIS — E1122 Type 2 diabetes mellitus with diabetic chronic kidney disease: Secondary | ICD-10-CM

## 2024-06-10 DIAGNOSIS — E559 Vitamin D deficiency, unspecified: Secondary | ICD-10-CM

## 2024-06-10 DIAGNOSIS — Z7984 Long term (current) use of oral hypoglycemic drugs: Secondary | ICD-10-CM

## 2024-06-10 DIAGNOSIS — Z794 Long term (current) use of insulin: Secondary | ICD-10-CM

## 2024-06-10 DIAGNOSIS — N1832 Chronic kidney disease, stage 3b: Secondary | ICD-10-CM

## 2024-06-10 MED ORDER — DAPAGLIFLOZIN PROPANEDIOL 10 MG TABLET: 10.0000 mg | ORAL_TABLET | Freq: Every day | ORAL | 3 refills | Status: AC

## 2024-06-10 MED ORDER — ALLOPURINOL 100 MG TABLET: 100.0000 mg | ORAL_TABLET | Freq: Every day | ORAL | 3 refills | Status: AC

## 2024-06-10 NOTE — Progress Notes (Unsigned)
 NEPHROLOGY, [facility]  [address]  [place] [address]    Progress Note    Name: Matthew Sparks MRN:  [id number]   Date: 06/10/2024 DOB:  06-Jul-1955 (68 y.o.)              Nephrology Out  Patient Visit       HPI : 68 y.o.   male here for chronic kidney disease :creatinine is improved  2.02  GFR 35  CKD Stage IV. Past medical history chronic kidney disease, hypertension, insulin -dependent diabetes mellitus type II, and hyperlipidemia.   Denies hematuria and dysuria.  Blood pressure is at goal.  Hemoglobin A1c less than 7.  Denies hematuria dysuria..  Reports no issues with edema currently taking chlorthalidone  25 mg daily..  Blood pressure is at goal currently taking Farxiga ..  Denies nausea and vomiting not currently taking NSAIDs.      ROS:      Assessment  was negative except what mentioned in in the HPI.       OBJECTIVE:   BP (!) 123/55   Pulse 67   Ht 1.778 m (5' 10)   Wt 92.1 kg (203 lb)   BMI 29.13 kg/m       General:  NAD, AAOx3  HEENT:  EOMI,  no icterus  NECK: No increased JVD.  Normal inspection   HEART:   Regular rhythm. No murmurs or rubs. No chest wall tenderness   LUNGS: Clear to auscultation bilateral. No wheezes, rales, or rhonchi.   ABDOMEN: +BS, Soft, nontender and nondistended. No rebound or guarding present.   EXTREMITIES: No edema. No cyanosis or clubbing.No asterixis    NEURO : Normal speech, EOMI.       LABORATORY DATA:   Lab Results   Component Value Date    BUN 36 (H) 06/03/2024    BUN 32 (H) 05/17/2024    BUN 34 (H) 03/11/2024    CREATININE 2.02 (H) 06/03/2024    CREATININE 1.97 (H) 05/17/2024    CREATININE 1.90 (H) 03/11/2024    BUNCRRATIO 18 06/03/2024    BUNCRRATIO 16 05/17/2024    BUNCRRATIO 18 03/11/2024    GFR 35 (L) 06/03/2024    GFR 36 (L) 05/17/2024    GFR 37 (L) 03/11/2024    SODIUM 142 06/03/2024    SODIUM 143 05/17/2024    SODIUM 141 03/11/2024    POTASSIUM 3.7 06/03/2024    POTASSIUM 3.7 05/17/2024    POTASSIUM 3.7 03/11/2024    CHLORIDE 113 (H)  06/03/2024    CHLORIDE 108 (H) 05/17/2024    CHLORIDE 109 (H) 03/11/2024    CO2 22 06/03/2024    CO2 23 05/17/2024    CO2 23 03/11/2024    ANIONGAP 7 06/03/2024    ANIONGAP 12 05/17/2024    ANIONGAP 9 03/11/2024    CALCIUM 9.7 06/03/2024    CALCIUM 9.1 05/17/2024    CALCIUM 9.9 03/11/2024    PHOSPHORUS 5.0 12/10/2023    PHOSPHORUS 3.4 (L) 10/08/2022    PHOSPHORUS 3.8 03/27/2022    ALBUMIN 4.1 05/17/2024    ALBUMIN 4.6 12/31/2023    ALBUMIN 4.1 12/10/2023    HGB 13.2 06/03/2024    HGB 12.6 05/17/2024    HGB 11.6 (L) 03/11/2024    HCT 40.4 06/03/2024    HCT 37.3 05/17/2024    HCT 34.5 (L) 03/11/2024    INTACTPTH 33.7 06/03/2024    INTACTPTH 25.6 03/11/2024    INTACTPTH 56.0 12/10/2023    IRON 112 10/08/2022  IRONBINDCAP 424 10/08/2022    IRONSAT 26 10/08/2022           MEDICATIONS:  No outpatient medications have been marked as taking for the 06/10/24 encounter (Office Visit) with Cathryne Iha, APRN, CNP.     Current Outpatient Medications   Medication Instructions    albuterol (PROVENTIL) 2.5 mg, 3 TIMES DAILY    albuterol sulfate (PROVENTIL OR VENTOLIN OR PROAIR) 90 mcg/actuation Inhalation HFA Aerosol Inhaler 1-2 Puffs, EVERY 6 HOURS PRN    allopurinoL  (ZYLOPRIM ) 100 mg, Oral, Daily    amLODIPine  (NORVASC) 10 mg, Oral, Daily    aspirin  81 mg, Daily    atorvastatin  (LIPITOR) 80 mg, Oral, EVERY EVENING    Blood Sugar Diagnostic (BLOOD GLUCOSE TEST) Strip Use as directed    Blood-Glucose Meter Misc Glucometer of choice.  Use as directed.    Blood-Glucose Sensor (DEXCOM G7 SENSOR) Does not apply Device 1 Each, Does not apply, EVERY 10 DAYS    chlorTHALIDONE  (HYGROTON ) 25 mg, Oral, Daily    clopidogreL  (PLAVIX ) 75 mg, Oral, Daily    cyclobenzaprine  (FLEXERIL ) 10 mg, Oral, 3 TIMES DAILY PRN    dapagliflozin  propanediol (FARXIGA ) 10 mg, Oral, Daily    DEXCOM G7 SENSOR Does not apply Device REPLACE SENSOR EVERY 10 DAYS    dulaglutide  0.75 mg, Subcutaneous, EVERY 7 DAYS    ergocalciferol  (vitamin D2) (DRISDOL ) 50,000  Units, Oral, EVERY 7 DAYS    glimepiride (AMARYL) 4 mg, 2 TIMES DAILY    insulin  glargine U-300 conc (TOUJEO  MAX U-300 SOLOSTAR) 50 Units, Subcutaneous, 2 TIMES DAILY    ipratropium-albuterol 0.5 mg-3 mg(2.5 mg base)/3 mL Solution for Nebulization 3 mL    isosorbide mononitrate (IMDUR) 30 mg, Daily    Lancets Misc Use as directed    losartan (COZAAR) 25 mg, Daily    metoprolol  succinate (TOPROL -XL) 100 mg, Oral, Daily    metoprolol  succinate (TOPROL -XL) 50 mg, Daily    multivitamin Oral Tablet 1 Tablet, Daily    nitroGLYCERIN  (NITROSTAT ) 0.3 mg, EVERY 5 MIN PRN    omega 3-dha-epa-fish oil 183.3 mg-75 mg -91.6 mg-306 mg Oral Capsule 1 Capsule, Daily    potassium chloride  (K-DUR) 10 mEq Oral Tab Sust.Rel. Particle/Crystal 10 mEq, Oral, Daily       ASSESSMENT / PLAN:   ENCOUNTER DIAGNOSES     ICD-10-CM   1. 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 Vitamin D  deficiency  E55.9                Chronic kidney disease  -Stage IIIb  -Due to diabetes and hypertension  -Creatinine is 2.SABRA02 GFR 35   -Baseline creatinine :  1.92  -Total protein to creatinine ratio:  improved  0.88  -CBC and a basic metabolic panel  -Return to clinic in 3 months  -Continue low-sodium diet  -balanced Fluid intake 50  oz a day.    -Avoid NSAIDs.  Tylenol  only for pain  -Renal imaging: No hydronephrosis no cysts or large masses  -ACEI or ARBS:  Losartan 25 mg daily  -Sodium Glucose Cotransporter-2 (SGLT2) Inhibitors: Farxiga  10 mg daily           Proteinuria  -negative   -serology          Hypertension  -Blood pressure is acceptable  -Goal less than 130/80  -Continue losartan 25 mg daily  -continue amlodipine  10 mg daily  -chlorthalidone  50 mg daily  -Low-salt diet        Diabetes  type 2  -A1c goal is less than 7   Lab Results   Component Value Date    HA1C 7.2 (H) 06/03/2024      -Continue Farxiga  10 mg daily  -continue sliding scale insulin   -Diabetic diet  -Increase activity and exercise as possible  -Monitor  A1C        Hyperuricemia  -Check uric acid   Lab Results   Component Value Date    URICACID 8.4 (H) 06/03/2024       -Low uric acid diet  -allopurinol  100 mg daily         Vitamin D  deficiency  -vitamin-D level acceptable   -continue to monitor vitamin-D level              I have discussed with the patient the following issues:  The main goal of treatment is to prevent progression of CKD to complete kidney failure. The best way to do this is to control the underlying cause.  the optimal diet is one similar to the Dietary Approaches to Stop Hypertension (DASH) diet, consisting of fruits, vegetables, legumes, fish, poultry, and whole grains.  Restrict sodium intake to less than 2 g/day        Orders Placed This Encounter    BASIC METABOLIC PANEL    CBC/DIFF    MAGNESIUM     MICROALBUMIN/CREATININE RATIO, URINE, RANDOM    PARATHYROID  HORMONE (PTH)    PROTEIN/CREATININE RATIO, URINE, RANDOM    URIC ACID    VITAMIN D  25 TOTAL       Suzen Risser, NP

## 2024-06-29 ENCOUNTER — Telehealth (INDEPENDENT_AMBULATORY_CARE_PROVIDER_SITE_OTHER): Payer: Self-pay

## 2024-06-29 ENCOUNTER — Encounter (INDEPENDENT_AMBULATORY_CARE_PROVIDER_SITE_OTHER)

## 2024-06-29 ENCOUNTER — Other Ambulatory Visit: Payer: Self-pay

## 2024-06-29 DIAGNOSIS — Z7984 Long term (current) use of oral hypoglycemic drugs: Secondary | ICD-10-CM

## 2024-06-29 DIAGNOSIS — Z794 Long term (current) use of insulin: Secondary | ICD-10-CM

## 2024-06-29 DIAGNOSIS — N1831 Chronic kidney disease, stage 3a: Secondary | ICD-10-CM

## 2024-06-29 DIAGNOSIS — E1122 Type 2 diabetes mellitus with diabetic chronic kidney disease: Secondary | ICD-10-CM

## 2024-06-29 MED ORDER — LINAGLIPTIN 5 MG TABLET: 5.0000 mg | ORAL_TABLET | Freq: Every day | ORAL | 4 refills | Status: AC

## 2024-06-29 NOTE — Progress Notes (Signed)
 Patient is doing Faxiga 10 mg daily  Toujeo  50 units BID    He discontinued the Trulicity  because it also gave him watery eyes.      Continuous Glucose Monitoring (CGM) Device   I uploaded, viewed the images and reviewed the data, interpreted, and made recommendations based on patient's CGM:      Full report to be scanned into media.     Interpretation:   Time period of downloaded data: 14 days.     Patterns identified: 72% range, 26% high, 2% very high, 0% low     Recommendations sent to patient via mychart:    He said his sugars were higher than they normally have been.  I explained they were not bad overall but per Dr. Lazarus recommendation we can add Tradjenta .    I have reviewed at your blood sugars and have a few recommendations for you.

## 2024-06-29 NOTE — Telephone Encounter (Signed)
 Matthew Sparks (Self) called regarding his diabetic medications and recent blood sugar readings. Patient stated that he had to stop using his Trulicity  because it was giving him the same reaction (itchy, watery eyes) that he had when he was taking Ozempic - since discontinuing this med, his readings have been higher than normal. Without the trulicity , his current diabetic regimen is Toujeo  50 units BID. I downloaded his CGM report and uploaded it into his chart (see media).     Patient asked about being placed on an additional medication to help control his readings - patient stated that his last endocrinologist had him on Glimepiride and that he would like to be on something like this again.   Provider notified via staff message.    Patient put on schedule for today, 06/29/24, for a CGM interpretation. Advised patient that our clinic would reach out to him with any recommendations made by his provider within the next couple of days.     Offered patient opportunity to ask questions, patient verbalized understanding. Advised to call back with any questions/concerns.

## 2024-07-11 ENCOUNTER — Emergency Department (HOSPITAL_COMMUNITY)

## 2024-07-11 ENCOUNTER — Inpatient Hospital Stay (HOSPITAL_COMMUNITY)

## 2024-07-11 ENCOUNTER — Encounter (HOSPITAL_COMMUNITY): Payer: Self-pay

## 2024-07-11 ENCOUNTER — Other Ambulatory Visit: Payer: Self-pay

## 2024-07-11 ENCOUNTER — Inpatient Hospital Stay
Admission: EM | Admit: 2024-07-11 | Discharge: 2024-07-13 | DRG: 580 | Disposition: A | Discharge status: Home Health | Source: Skilled Nursing Facility | Attending: HOSPITALIST | Admitting: HOSPITALIST

## 2024-07-11 DIAGNOSIS — L02215 Cutaneous abscess of perineum: Principal | ICD-10-CM | POA: Diagnosis present

## 2024-07-11 DIAGNOSIS — E669 Obesity, unspecified: Secondary | ICD-10-CM | POA: Diagnosis present

## 2024-07-11 DIAGNOSIS — Z7982 Long term (current) use of aspirin: Secondary | ICD-10-CM

## 2024-07-11 DIAGNOSIS — J4520 Mild intermittent asthma, uncomplicated: Secondary | ICD-10-CM | POA: Diagnosis present

## 2024-07-11 DIAGNOSIS — I6930 Unspecified sequelae of cerebral infarction: Secondary | ICD-10-CM

## 2024-07-11 DIAGNOSIS — R102 Pelvic and perineal pain unspecified side: Secondary | ICD-10-CM

## 2024-07-11 DIAGNOSIS — Z794 Long term (current) use of insulin: Secondary | ICD-10-CM

## 2024-07-11 DIAGNOSIS — Z79899 Other long term (current) drug therapy: Secondary | ICD-10-CM

## 2024-07-11 DIAGNOSIS — N184 Chronic kidney disease, stage 4 (severe): Secondary | ICD-10-CM | POA: Diagnosis present

## 2024-07-11 DIAGNOSIS — I10 Essential (primary) hypertension: Secondary | ICD-10-CM

## 2024-07-11 DIAGNOSIS — Z7984 Long term (current) use of oral hypoglycemic drugs: Secondary | ICD-10-CM

## 2024-07-11 DIAGNOSIS — Z6829 Body mass index (BMI) 29.0-29.9, adult: Secondary | ICD-10-CM

## 2024-07-11 DIAGNOSIS — Z8673 Personal history of transient ischemic attack (TIA), and cerebral infarction without residual deficits: Secondary | ICD-10-CM

## 2024-07-11 DIAGNOSIS — E7800 Pure hypercholesterolemia, unspecified: Secondary | ICD-10-CM | POA: Diagnosis present

## 2024-07-11 DIAGNOSIS — I861 Scrotal varices: Secondary | ICD-10-CM

## 2024-07-11 DIAGNOSIS — E1122 Type 2 diabetes mellitus with diabetic chronic kidney disease: Secondary | ICD-10-CM | POA: Diagnosis present

## 2024-07-11 DIAGNOSIS — E1165 Type 2 diabetes mellitus with hyperglycemia: Secondary | ICD-10-CM | POA: Diagnosis present

## 2024-07-11 DIAGNOSIS — I129 Hypertensive chronic kidney disease with stage 1 through stage 4 chronic kidney disease, or unspecified chronic kidney disease: Secondary | ICD-10-CM | POA: Diagnosis present

## 2024-07-11 DIAGNOSIS — Z87891 Personal history of nicotine dependence: Secondary | ICD-10-CM

## 2024-07-11 DIAGNOSIS — L290 Pruritus ani: Secondary | ICD-10-CM

## 2024-07-11 DIAGNOSIS — E1142 Type 2 diabetes mellitus with diabetic polyneuropathy: Secondary | ICD-10-CM | POA: Diagnosis present

## 2024-07-11 DIAGNOSIS — R222 Localized swelling, mass and lump, trunk: Secondary | ICD-10-CM

## 2024-07-11 DIAGNOSIS — Z7902 Long term (current) use of antithrombotics/antiplatelets: Secondary | ICD-10-CM

## 2024-07-11 DIAGNOSIS — E119 Type 2 diabetes mellitus without complications: Secondary | ICD-10-CM

## 2024-07-11 DIAGNOSIS — K760 Fatty (change of) liver, not elsewhere classified: Secondary | ICD-10-CM

## 2024-07-11 LAB — COMPREHENSIVE METABOLIC PANEL, NON-FASTING
ALBUMIN/GLOBULIN RATIO: 1.3 (ref 0.8–1.4)
ALBUMIN: 4.5 g/dL (ref 3.5–5.7)
ALKALINE PHOSPHATASE: 111 U/L — ABNORMAL HIGH (ref 34–104)
ALT (SGPT): 66 U/L — ABNORMAL HIGH (ref 7–52)
ANION GAP: 12 mmol/L (ref 4–13)
AST (SGOT): 39 U/L (ref 13–39)
BILIRUBIN TOTAL: 0.5 mg/dL (ref 0.3–1.0)
BUN/CREA RATIO: 20 (ref 6–22)
BUN: 43 mg/dL — ABNORMAL HIGH (ref 7–25)
CALCIUM, CORRECTED: 9.4 mg/dL (ref 8.9–10.8)
CALCIUM: 9.8 mg/dL (ref 8.6–10.3)
CHLORIDE: 112 mmol/L — ABNORMAL HIGH (ref 98–107)
CO2 TOTAL: 18 mmol/L — ABNORMAL LOW (ref 21–31)
CREATININE: 2.17 mg/dL — ABNORMAL HIGH (ref 0.60–1.30)
ESTIMATED GFR: 32 mL/min/1.73mˆ2 — ABNORMAL LOW (ref >59)
GLOBULIN: 3.6 — ABNORMAL HIGH (ref 2.0–3.5)
GLUCOSE: 132 mg/dL — ABNORMAL HIGH (ref 74–109)
OSMOLALITY, CALCULATED: 296 mosm/kg — ABNORMAL HIGH (ref 270–290)
POTASSIUM: 3.7 mmol/L (ref 3.5–5.1)
PROTEIN TOTAL: 8.1 g/dL (ref 6.4–8.9)
SODIUM: 142 mmol/L (ref 136–145)

## 2024-07-11 LAB — CBC WITH DIFF
BASOPHIL #: 0 x10ˆ3/uL (ref 0.00–0.10)
BASOPHIL %: 0 % (ref 0–1)
EOSINOPHIL #: 0.3 x10ˆ3/uL (ref 0.00–0.60)
EOSINOPHIL %: 2 % (ref 1–8)
HCT: 35.8 % — ABNORMAL LOW (ref 36.7–47.1)
HGB: 12.2 g/dL — ABNORMAL LOW (ref 12.5–16.3)
LYMPHOCYTE #: 1.3 x10ˆ3/uL (ref 1.00–3.00)
LYMPHOCYTE %: 11 % — ABNORMAL LOW (ref 15–43)
MCH: 29.9 pg (ref 23.8–33.4)
MCHC: 34 g/dL (ref 32.5–36.3)
MCV: 87.9 fL (ref 73.0–96.2)
MONOCYTE #: 1 x10ˆ3/uL (ref 0.30–1.10)
MONOCYTE %: 9 % (ref 6–14)
MPV: 10.7 fL (ref 7.4–11.4)
NEUTROPHIL #: 9.1 x10ˆ3/uL — ABNORMAL HIGH (ref 1.85–7.84)
NEUTROPHIL %: 78 % — ABNORMAL HIGH (ref 44–74)
PLATELETS: 167 x10ˆ3/uL (ref 140–440)
RBC: 4.07 x10ˆ6/uL (ref 4.06–5.63)
RDW: 14.2 % (ref 12.1–16.2)
WBC: 11.7 x10ˆ3/uL — ABNORMAL HIGH (ref 3.6–10.2)

## 2024-07-11 LAB — PT/INR
INR: 1.11 — ABNORMAL HIGH (ref 0.84–1.10)
PROTHROMBIN TIME: 12.5 s (ref 9.8–12.7)

## 2024-07-11 LAB — C-REACTIVE PROTEIN(CRP),INFLAMMATION: C-REACTIVE PROTEIN (CRP): 2.9 mg/dL — ABNORMAL HIGH (ref 0.1–0.5)

## 2024-07-11 LAB — PTT (PARTIAL THROMBOPLASTIN TIME): APTT: 36.2 s (ref 25.0–38.0)

## 2024-07-11 LAB — POC BLOOD GLUCOSE (RESULTS)
GLUCOSE, POC: 129 mg/dL — ABNORMAL HIGH (ref 70–100)
GLUCOSE, POC: 78 mg/dL (ref 70–100)

## 2024-07-11 LAB — LACTIC ACID LEVEL W/ REFLEX FOR LEVEL >2.0: LACTIC ACID: 0.7 mmol/L (ref 0.5–2.2)

## 2024-07-11 LAB — SEDIMENTATION RATE: ERYTHROCYTE SEDIMENTATION RATE (ESR): 56 mm/h — ABNORMAL HIGH (ref <20)

## 2024-07-11 MED ORDER — SODIUM CHLORIDE 0.9 % INTRAVENOUS SOLUTION
4.5000 g | Freq: Three times a day (TID) | INTRAVENOUS | Status: DC
  Administered 2024-07-12: 4.5 g via INTRAVENOUS
  Administered 2024-07-12 (×2): 0 g via INTRAVENOUS
  Administered 2024-07-12 – 2024-07-13 (×3): 4.5 g via INTRAVENOUS
  Administered 2024-07-13 (×2): 0 g via INTRAVENOUS
  Filled 2024-07-11 (×3): qty 20

## 2024-07-11 MED ORDER — MORPHINE 2 MG/ML INJECTION WRAPPER
INJECTION | INTRAMUSCULAR | Status: AC
  Filled 2024-07-11: qty 1

## 2024-07-11 MED ORDER — MORPHINE 4 MG/ML INJECTION WRAPPER
INJECTION | INTRAMUSCULAR | Status: AC
  Filled 2024-07-11: qty 1

## 2024-07-11 MED ORDER — LACTATED RINGERS INTRAVENOUS SOLUTION
INTRAVENOUS | Status: DC
  Administered 2024-07-13: 0 mL via INTRAVENOUS

## 2024-07-11 MED ORDER — HYDROCHLOROTHIAZIDE 25 MG TABLET
25.0000 mg | ORAL_TABLET | Freq: Every day | ORAL | Status: DC
  Administered 2024-07-12 – 2024-07-13 (×2): 25 mg via ORAL
  Filled 2024-07-11 (×2): qty 1

## 2024-07-11 MED ORDER — DEXTROSE 40 % ORAL GEL: 15.0000 g | ORAL | Status: DC | PRN

## 2024-07-11 MED ORDER — HEPARIN (PORCINE) 5,000 UNIT/ML INJECTION SOLUTION
5000.0000 [IU] | Freq: Three times a day (TID) | INTRAMUSCULAR | Status: DC
  Administered 2024-07-12 – 2024-07-13 (×3): 0 [IU] via SUBCUTANEOUS
  Filled 2024-07-11 (×2): qty 1

## 2024-07-11 MED ORDER — METOPROLOL SUCCINATE ER 50 MG TABLET,EXTENDED RELEASE 24 HR
150.0000 mg | ORAL_TABLET | Freq: Every day | ORAL | Status: DC
  Administered 2024-07-12 – 2024-07-13 (×2): 150 mg via ORAL
  Filled 2024-07-11 (×2): qty 3

## 2024-07-11 MED ORDER — CLOPIDOGREL 75 MG TABLET: 75.0000 mg | ORAL_TABLET | Freq: Every day | ORAL | Status: DC

## 2024-07-11 MED ORDER — HYDROMORPHONE (PF) 2 MG/ML INJECTION SOLUTION
0.2000 mg | INTRAMUSCULAR | Status: DC | PRN
  Administered 2024-07-11 – 2024-07-13 (×5): 0.2 mg via INTRAVENOUS
  Filled 2024-07-11 (×5): qty 1

## 2024-07-11 MED ORDER — CEFTRIAXONE 2 GRAM SOLUTION FOR INJECTION
INTRAMUSCULAR | Status: AC
  Filled 2024-07-11: qty 20

## 2024-07-11 MED ORDER — GLUCAGON HCL 1 MG/ML SOLUTION FOR INJECTION: 1.0000 mg | Freq: Once | INTRAMUSCULAR | Status: DC | PRN

## 2024-07-11 MED ORDER — LOSARTAN 50 MG TABLET
100.0000 mg | ORAL_TABLET | Freq: Every day | ORAL | Status: DC
  Administered 2024-07-12 – 2024-07-13 (×2): 100 mg via ORAL
  Filled 2024-07-11 (×2): qty 2

## 2024-07-11 MED ORDER — SODIUM CHLORIDE 0.9 % INTRAVENOUS SOLUTION
2.0000 g | INTRAVENOUS | Status: AC
  Administered 2024-07-11: 0 g via INTRAVENOUS
  Administered 2024-07-11: 2 g via INTRAVENOUS

## 2024-07-11 MED ORDER — MORPHINE 2 MG/ML INJECTION WRAPPER
2.0000 mg | INJECTION | INTRAMUSCULAR | Status: AC
  Administered 2024-07-11: 2 mg via INTRAVENOUS

## 2024-07-11 MED ORDER — LINEZOLID IN 5% DEXTROSE IN WATER 600 MG/300 ML INTRAVENOUS PIGGYBACK
600.0000 mg | INJECTION | Freq: Two times a day (BID) | INTRAVENOUS | Status: DC
  Administered 2024-07-11: 600 mg via INTRAVENOUS
  Administered 2024-07-12 (×2): 0 mg via INTRAVENOUS
  Administered 2024-07-12 – 2024-07-13 (×2): 600 mg via INTRAVENOUS
  Administered 2024-07-13: 0 mg via INTRAVENOUS
  Filled 2024-07-11 (×3): qty 300

## 2024-07-11 MED ORDER — ASPIRIN 325 MG TABLET
81.0000 mg | ORAL_TABLET | Freq: Every day | ORAL | Status: DC
  Administered 2024-07-12: 81 mg via ORAL
  Administered 2024-07-13: 0 mg via ORAL
  Filled 2024-07-11 (×2): qty 1

## 2024-07-11 MED ORDER — ISOSORBIDE MONONITRATE ER 30 MG TABLET,EXTENDED RELEASE 24 HR
30.0000 mg | ORAL_TABLET | Freq: Every day | ORAL | Status: DC
  Administered 2024-07-12 – 2024-07-13 (×2): 30 mg via ORAL
  Filled 2024-07-11 (×2): qty 1

## 2024-07-11 MED ORDER — SODIUM CHLORIDE 0.9 % INTRAVENOUS SOLUTION
INTRAVENOUS | Status: AC
  Filled 2024-07-11: qty 50

## 2024-07-11 MED ORDER — ATORVASTATIN 40 MG TABLET
80.0000 mg | ORAL_TABLET | Freq: Every evening | ORAL | Status: DC
  Administered 2024-07-11 – 2024-07-12 (×2): 80 mg via ORAL
  Filled 2024-07-11: qty 2

## 2024-07-11 MED ORDER — MORPHINE 4 MG/ML INJECTION WRAPPER
4.0000 mg | INJECTION | INTRAMUSCULAR | Status: AC
  Administered 2024-07-11: 4 mg via INTRAVENOUS

## 2024-07-11 MED ORDER — IPRATROPIUM 0.5 MG-ALBUTEROL 3 MG (2.5 MG BASE)/3 ML NEBULIZATION SOLN: 3.0000 mL | INHALATION_SOLUTION | RESPIRATORY_TRACT | Status: DC | PRN

## 2024-07-11 MED ORDER — AMLODIPINE 10 MG TABLET
10.0000 mg | ORAL_TABLET | Freq: Every day | ORAL | Status: DC
  Administered 2024-07-12 – 2024-07-13 (×2): 10 mg via ORAL
  Filled 2024-07-11 (×2): qty 1

## 2024-07-11 MED ORDER — ACETAMINOPHEN 325 MG TABLET: 650.0000 mg | ORAL_TABLET | ORAL | Status: DC | PRN

## 2024-07-11 MED ORDER — METOPROLOL SUCCINATE ER 50 MG TABLET,EXTENDED RELEASE 24 HR: 50.0000 mg | ORAL_TABLET | Freq: Every day | ORAL | Status: DC

## 2024-07-11 MED ORDER — DEXTROSE 50 % IN WATER (D50W) INTRAVENOUS SYRINGE: 12.5000 g | INJECTION | INTRAVENOUS | Status: DC | PRN

## 2024-07-11 MED ORDER — INSULIN GLARGINE 100 UNITS/ML SUBQ - CHARGE BY DOSE
25.0000 [IU] | Freq: Two times a day (BID) | SUBCUTANEOUS | Status: DC
  Administered 2024-07-11 – 2024-07-13 (×4): 25 [IU] via SUBCUTANEOUS
  Filled 2024-07-11 (×3): qty 25

## 2024-07-11 MED ORDER — ONDANSETRON HCL (PF) 4 MG/2 ML INJECTION SOLUTION
4.0000 mg | Freq: Four times a day (QID) | INTRAMUSCULAR | Status: DC | PRN
  Administered 2024-07-12: 4 mg via INTRAVENOUS
  Filled 2024-07-11: qty 2

## 2024-07-11 MED ORDER — INSULIN LISPRO 100 UNIT/ML SUB-Q SSIP VIAL
2.0000 [IU] | INJECTION | Freq: Four times a day (QID) | SUBCUTANEOUS | Status: DC
  Administered 2024-07-11: 0 [IU] via SUBCUTANEOUS
  Administered 2024-07-12: 3 [IU] via SUBCUTANEOUS
  Administered 2024-07-12 (×3): 0 [IU] via SUBCUTANEOUS
  Administered 2024-07-13: 2 [IU] via SUBCUTANEOUS
  Filled 2024-07-11: qty 2
  Filled 2024-07-11: qty 3
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
## 2024-07-11 NOTE — ED Nurses Note (Signed)
 Pt arrived to the ED via ambulation accompanied by his spouse for complaints of what he believes is an allergic reaction. Pt states he was prescribed a new medication on or about June 29, 2024 and states when he began taking the medication, his skin became itchy and he noticed his scrotum had begun to swell. Pt states the edema continued to worsen each day. Pt has been connected to bedside monitoring. Call light and personal belongings within reach.
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
## 2024-07-11 NOTE — ED Nurses Note (Signed)
 Called report to [place] on [address].

## 2024-07-11 NOTE — ED Triage Notes (Signed)
 Pt reports that he believes he is having a reaction to a new diabetic medication. Pt reports that they changed the brand of one of his medication (TOUJEO ) to a different brand of the same medication, also started on a new medication (Tradjenta  5mg ) on 06/29/24 and reports that he is having itching feeling throughout his body and cannot get the feeling to go away.

## 2024-07-12 ENCOUNTER — Inpatient Hospital Stay (HOSPITAL_COMMUNITY): Admitting: Anesthesiology

## 2024-07-12 ENCOUNTER — Encounter (HOSPITAL_COMMUNITY): Admission: EM | Disposition: A | Payer: Self-pay | Source: Intra-hospital | Attending: Internal Medicine

## 2024-07-12 DIAGNOSIS — Z8673 Personal history of transient ischemic attack (TIA), and cerebral infarction without residual deficits: Secondary | ICD-10-CM

## 2024-07-12 DIAGNOSIS — Z7902 Long term (current) use of antithrombotics/antiplatelets: Secondary | ICD-10-CM

## 2024-07-12 DIAGNOSIS — I129 Hypertensive chronic kidney disease with stage 1 through stage 4 chronic kidney disease, or unspecified chronic kidney disease: Secondary | ICD-10-CM

## 2024-07-12 DIAGNOSIS — N1832 Chronic kidney disease, stage 3b: Secondary | ICD-10-CM

## 2024-07-12 DIAGNOSIS — L02215 Cutaneous abscess of perineum: Principal | ICD-10-CM

## 2024-07-12 DIAGNOSIS — K651 Peritoneal abscess: Secondary | ICD-10-CM

## 2024-07-12 DIAGNOSIS — E1165 Type 2 diabetes mellitus with hyperglycemia: Secondary | ICD-10-CM

## 2024-07-12 DIAGNOSIS — Z7982 Long term (current) use of aspirin: Secondary | ICD-10-CM

## 2024-07-12 LAB — COMPREHENSIVE METABOLIC PANEL, NON-FASTING
ALBUMIN/GLOBULIN RATIO: 1.2 (ref 0.8–1.4)
ALBUMIN: 4 g/dL (ref 3.5–5.7)
ALKALINE PHOSPHATASE: 98 U/L (ref 34–104)
ALT (SGPT): 54 U/L — ABNORMAL HIGH (ref 7–52)
ANION GAP: 11 mmol/L (ref 4–13)
AST (SGOT): 29 U/L (ref 13–39)
BILIRUBIN TOTAL: 0.5 mg/dL (ref 0.3–1.0)
BUN/CREA RATIO: 19 (ref 6–22)
BUN: 39 mg/dL — ABNORMAL HIGH (ref 7–25)
CALCIUM, CORRECTED: 9.4 mg/dL (ref 8.9–10.8)
CALCIUM: 9.4 mg/dL (ref 8.6–10.3)
CHLORIDE: 110 mmol/L — ABNORMAL HIGH (ref 98–107)
CO2 TOTAL: 18 mmol/L — ABNORMAL LOW (ref 21–31)
CREATININE: 2.01 mg/dL — ABNORMAL HIGH (ref 0.60–1.30)
ESTIMATED GFR: 35 mL/min/1.73mˆ2 — ABNORMAL LOW (ref >59)
GLOBULIN: 3.3 (ref 2.0–3.5)
GLUCOSE: 218 mg/dL — ABNORMAL HIGH (ref 74–109)
OSMOLALITY, CALCULATED: 294 mosm/kg — ABNORMAL HIGH (ref 270–290)
POTASSIUM: 3.3 mmol/L — ABNORMAL LOW (ref 3.5–5.1)
PROTEIN TOTAL: 7.3 g/dL (ref 6.4–8.9)
SODIUM: 139 mmol/L (ref 136–145)

## 2024-07-12 LAB — CBC WITH DIFF
BASOPHIL #: 0 x10ˆ3/uL (ref 0.00–0.10)
BASOPHIL %: 1 % (ref 0–1)
EOSINOPHIL #: 0.2 x10ˆ3/uL (ref 0.00–0.60)
EOSINOPHIL %: 2 % (ref 1–8)
HCT: 34 % — ABNORMAL LOW (ref 36.7–47.1)
HGB: 11.5 g/dL — ABNORMAL LOW (ref 12.5–16.3)
LYMPHOCYTE #: 1.3 x10ˆ3/uL (ref 1.00–3.00)
LYMPHOCYTE %: 13 % — ABNORMAL LOW (ref 15–43)
MCH: 29.7 pg (ref 23.8–33.4)
MCHC: 33.9 g/dL (ref 32.5–36.3)
MCV: 87.8 fL (ref 73.0–96.2)
MONOCYTE #: 1.1 x10ˆ3/uL (ref 0.30–1.10)
MONOCYTE %: 11 % (ref 6–14)
MPV: 11.3 fL (ref 7.4–11.4)
NEUTROPHIL #: 7.6 x10ˆ3/uL (ref 1.85–7.84)
NEUTROPHIL %: 74 % (ref 44–74)
PLATELETS: 155 x10ˆ3/uL (ref 140–440)
RBC: 3.87 x10ˆ6/uL — ABNORMAL LOW (ref 4.06–5.63)
RDW: 14.2 % (ref 12.1–16.2)
WBC: 10.2 x10ˆ3/uL (ref 3.6–10.2)

## 2024-07-12 LAB — POC BLOOD GLUCOSE (RESULTS)
GLUCOSE, POC: 137 mg/dL — ABNORMAL HIGH (ref 70–100)
GLUCOSE, POC: 111 mg/dL — ABNORMAL HIGH (ref 70–100)
GLUCOSE, POC: 125 mg/dL — ABNORMAL HIGH (ref 70–100)
GLUCOSE, POC: 122 mg/dL — ABNORMAL HIGH (ref 70–100)
GLUCOSE, POC: 240 mg/dL — ABNORMAL HIGH (ref 70–100)

## 2024-07-12 LAB — MAGNESIUM: MAGNESIUM: 2 mg/dL (ref 1.9–2.7)

## 2024-07-12 MED ORDER — DEXAMETHASONE SODIUM PHOSPHATE 4 MG/ML INJECTION SOLUTION
INTRAMUSCULAR | Status: AC
  Filled 2024-07-12: qty 1

## 2024-07-12 MED ORDER — FAMOTIDINE (PF) 20 MG/2 ML INTRAVENOUS SOLUTION
20.0000 mg | Freq: Once | INTRAVENOUS | Status: AC
  Administered 2024-07-12: 20 mg via INTRAVENOUS

## 2024-07-12 MED ORDER — ONDANSETRON HCL (PF) 4 MG/2 ML INJECTION SOLUTION
Freq: Once | INTRAMUSCULAR | Status: DC | PRN
  Administered 2024-07-12: 4 mg via INTRAVENOUS

## 2024-07-12 MED ORDER — DEXAMETHASONE SODIUM PHOSPHATE 4 MG/ML INJECTION SOLUTION
4.0000 mg | Freq: Once | INTRAMUSCULAR | Status: AC
  Administered 2024-07-12: 4 mg via INTRAVENOUS

## 2024-07-12 MED ORDER — SODIUM CHLORIDE 0.9% FLUSH BAG - 250 ML: INTRAVENOUS | Status: DC | PRN

## 2024-07-12 MED ORDER — ONDANSETRON HCL (PF) 4 MG/2 ML INJECTION SOLUTION
4.0000 mg | Freq: Once | INTRAMUSCULAR | Status: AC
  Administered 2024-07-12: 4 mg via INTRAVENOUS

## 2024-07-12 MED ORDER — PROCHLORPERAZINE EDISYLATE 10 MG/2 ML (5 MG/ML) INJECTION SOLUTION: 5.0000 mg | Freq: Once | INTRAMUSCULAR | Status: DC | PRN

## 2024-07-12 MED ORDER — ONDANSETRON HCL (PF) 4 MG/2 ML INJECTION SOLUTION
INTRAMUSCULAR | Status: AC
  Filled 2024-07-12: qty 2

## 2024-07-12 MED ORDER — FENTANYL (PF) 50 MCG/ML INJECTION WRAPPER: 50.0000 ug | INJECTION | INTRAMUSCULAR | Status: DC | PRN

## 2024-07-12 MED ORDER — LACTATED RINGERS INTRAVENOUS SOLUTION: INTRAVENOUS | Status: DC

## 2024-07-12 MED ORDER — SODIUM CHLORIDE 0.9 % (FLUSH) INJECTION SYRINGE: 3.0000 mL | INJECTION | INTRAMUSCULAR | Status: DC | PRN

## 2024-07-12 MED ORDER — SODIUM CHLORIDE 0.9 % (FLUSH) INJECTION SYRINGE: 3.0000 mL | INJECTION | Freq: Three times a day (TID) | INTRAMUSCULAR | Status: DC

## 2024-07-12 MED ORDER — FENTANYL (PF) 50 MCG/ML INJECTION SOLUTION
INTRAMUSCULAR | Status: AC
  Filled 2024-07-12: qty 2

## 2024-07-12 MED ORDER — ONDANSETRON HCL (PF) 4 MG/2 ML INJECTION SOLUTION: 4.0000 mg | Freq: Once | INTRAMUSCULAR | Status: DC | PRN

## 2024-07-12 MED ORDER — ALBUTEROL SULFATE 2.5 MG/3 ML (0.083 %) SOLUTION FOR NEBULIZATION: 2.5000 mg | INHALATION_SOLUTION | Freq: Once | RESPIRATORY_TRACT | Status: DC | PRN

## 2024-07-12 MED ORDER — FENTANYL (PF) 50 MCG/ML INJECTION WRAPPER: 25.0000 ug | INJECTION | INTRAMUSCULAR | Status: DC | PRN

## 2024-07-12 MED ORDER — SUGAMMADEX 100 MG/ML INTRAVENOUS SOLUTION
Freq: Once | INTRAVENOUS | Status: DC | PRN
  Administered 2024-07-12: 200 mg via INTRAVENOUS

## 2024-07-12 MED ORDER — FAMOTIDINE (PF) 20 MG/2 ML INTRAVENOUS SOLUTION
INTRAVENOUS | Status: AC
  Filled 2024-07-12: qty 2

## 2024-07-12 MED ORDER — FENTANYL (PF) 50 MCG/ML INJECTION WRAPPER
INJECTION | Freq: Once | INTRAMUSCULAR | Status: DC | PRN
  Administered 2024-07-12: 100 ug via INTRAVENOUS

## 2024-07-12 MED ORDER — PIPERACILLIN-TAZOBACTAM 4.5 GRAM INTRAVENOUS SOLUTION
INTRAVENOUS | Status: AC
  Filled 2024-07-12: qty 20

## 2024-07-12 MED ORDER — IPRATROPIUM 0.5 MG-ALBUTEROL 3 MG (2.5 MG BASE)/3 ML NEBULIZATION SOLN: 3.0000 mL | INHALATION_SOLUTION | Freq: Once | RESPIRATORY_TRACT | Status: DC | PRN

## 2024-07-12 MED ORDER — DEXTROSE 5% IN WATER (D5W) FLUSH BAG - 250 ML: INTRAVENOUS | Status: DC | PRN

## 2024-07-12 MED ORDER — PROPOFOL 10 MG/ML IV BOLUS
INJECTION | Freq: Once | INTRAVENOUS | Status: DC | PRN
  Administered 2024-07-12: 150 mg via INTRAVENOUS

## 2024-07-12 MED ORDER — SODIUM CHLORIDE 0.9 % INTRAVENOUS SOLUTION
INTRAVENOUS | Status: AC
  Filled 2024-07-12: qty 100

## 2024-07-12 MED ORDER — ROCURONIUM 10 MG/ML INTRAVENOUS SOLUTION
Freq: Once | INTRAVENOUS | Status: DC | PRN
  Administered 2024-07-12: 50 mg via INTRAVENOUS

## 2024-07-12 MED ORDER — POTASSIUM CHLORIDE ER 20 MEQ TABLET,EXTENDED RELEASE(PART/CRYST)
20.0000 meq | ORAL_TABLET | Freq: Once | ORAL | Status: AC
  Administered 2024-07-12: 20 meq via ORAL
  Filled 2024-07-12: qty 1

## 2024-07-12 MED ORDER — LACTATED RINGERS INTRAVENOUS SOLUTION: INTRAVENOUS | Status: DC | PRN

## 2024-07-12 NOTE — Care Plan (Signed)
 Shift Summary  Severe scrotal pain occurred twice during the shift, requiring HYDROmorphone  administration for relief.   Antibiotic therapy was adjusted, with linezolid  stopped early in the shift and piperacillin -tazobactam restarted later.   Blood glucose levels remained mildly elevated throughout the shift.   Vital signs fluctuated but remained within a manageable range, and safety measures were maintained.   Overall, pain management and infection control interventions were provided as needed.     Goal Outcome Evaluation:     Anxieties, Fears or Concerns: Anxiety related to procedure (07/12/24 0800)  Individualized Care Needs: monitor labs and vitals, skin integrity (07/12/24 0800)  Patient-Specific Goals (Include Timeframe): Procedure today (07/12/24 0800)  Plan of Care Reviewed With: patient (07/12/24 0800)     Patient Progress: improving

## 2024-07-12 NOTE — Nurses Notes (Signed)
 Patient stood up to use urinal. Dressing to wound bleeding down leg and into floor. Patient laid back in bed with dressing changed. Packing stayed in. Patient states wanting to rest. Bed alarm set. Call bell in reach.

## 2024-07-12 NOTE — OR Surgeon (Signed)
 [facility]      Patient Name: Matthew Sparks, Dobie Number: [id number]  Date of Service: 07/12/2024   Date of Birth: 10/01/1955      Pre-Operative Diagnosis: Perineal abscess [L02.215]     Post-Operative Diagnosis: Perineal abscess [L02.215]    Procedure(s)/Description:  INCISION AND DRAINAGE PERINEAL  ABSCESS:        Findings: Perineal abscess [L02.215]     Attending Surgeon: Quintin Gerald, DO     Anesthesia:  Anesthesiologist: Cornett, Theadore Rigg, MD  CRNA: Germaine Grumet    Anesthesia Type: .General     First Assist: None     Estimated Blood Loss:  Minimal    Patient was brought to the operating room and placed in the supine position.  After induction of general anesthesia, the patient was transitioned to the lithotomy position.  The perineum was prepped and draped in the normal sterile fashion using Betadine solution.  After a time-out procedure was performed, a 10 blade scalpel was used to make a linear incision over the area of greatest fluctuance in the perineal region.  There was expression of copious amounts of purulence which was cultured and sent for analysis.  Blunt dissection was used to break up any loculations within the abscess cavity.  Hemostasis was then assured and antibiotic solution was used to irrigate the wound.  Hemostasis was once again assured and iodoform packing was placed into the abscess cavity.  A sterile dressing was then applied and the patient was reversed from anesthesia.  He tolerated the procedure well and was taken to the PACU for postoperative observation.  There were no issues throughout the case.      Levander Gerald, DO  General Surgery

## 2024-07-12 NOTE — Nurses Notes (Signed)
Accucheck 125
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
## 2024-07-12 NOTE — Anesthesia Postprocedure Evaluation (Signed)
 Anesthesia Post Op Evaluation    Patient: Matthew Sparks  Procedure(s):  INCISION AND DRAINAGE PERINEAL  ABSCESS    Last Vitals:Temperature: 36.8 C (98.2 F) (07/12/24 1350)  Heart Rate: 55 (07/12/24 1350)  BP (Non-Invasive): (!) 112/54 (07/12/24 1350)  Respiratory Rate: 16 (07/12/24 1350)  SpO2: 98 % (07/12/24 1350)    No notable events documented.    Patient is sufficiently recovered from the effects of anesthesia to participate in the evaluation and has returned to their pre-procedure level.  Patient location during evaluation: PACU       Patient participation: complete - patient participated  Level of consciousness: awake and alert and responsive to verbal stimuli    Pain management: adequate  Airway patency: patent    Anesthetic complications: no  Cardiovascular status: acceptable  Respiratory status: acceptable  Hydration status: acceptable  Patient post-procedure temperature: Pt Normothermic   PONV Status: Absent

## 2024-07-12 NOTE — Nurses Notes (Signed)
 Patient transported to room 205-A via stretcher by this nurse. Patient has no belongings upon transport. No family/visitors in waiting room. Camelia Pringle RN in room to receive patient. Patient's dressing to buttocks has moderate drainage. Patient's vital signs are as follows-  Temp: 97.75F  HR: 61  BP: 121/66  RR: 17  O2: 100% on room air.

## 2024-07-12 NOTE — Anesthesia Preprocedure Evaluation (Signed)
 ANESTHESIA PRE-OP EVALUATION  Planned Procedure: INCISION AND DRAINAGE PERINEAL  ABSCESS  Review of Systems     anesthesia history negative     patient summary reviewed  nursing notes reviewed        Pulmonary   asthma,   Cardiovascular    Hypertension and ECG reviewed ,No peripheral edema,  Exercise Tolerance: > or = 4 METS        GI/Hepatic/Renal   negative GI/hepatic/renal ROS,         Endo/Other      type 2 diabetes/ controlled with oral medications    Neuro/Psych/MS    TIA, CVA, anxiety    peripheral neuropathy,  Cancer    negative hematology/oncology ROS,                   Physical Assessment      Airway       Mallampati: II    TM distance: >3 FB    Neck ROM: full  Mouth Opening: good.            Dental           (+) missing           Pulmonary    Breath sounds clear to auscultation  (-) no rhonchi, no decreased breath sounds, no wheezes, no rales and no stridor     Cardiovascular    Rhythm: regular  Rate: Normal  (-) no friction rub, carotid bruit is not present, no peripheral edema and no murmur     Other findings            Plan  ASA 3     Planned anesthesia type: general     general anesthesia with endotracheal tube intubation      PONV Plan:  I plan to administer pharmcologic prophalaxis antiemetics                  Anesthesia issues/risks discussed are: Dental Injuries, Stroke, Aspiration, Cardiac Events/MI, Intraoperative Awareness/ Recall and Sore Throat.  Anesthetic plan and risks discussed with patient  signed consent obtained          Patient's NPO status is appropriate for Anesthesia.           Plan discussed with CRNA.
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
## 2024-07-13 DIAGNOSIS — E1122 Type 2 diabetes mellitus with diabetic chronic kidney disease: Secondary | ICD-10-CM

## 2024-07-13 DIAGNOSIS — T7840XA Allergy, unspecified, initial encounter: Secondary | ICD-10-CM

## 2024-07-13 DIAGNOSIS — Z9889 Other specified postprocedural states: Secondary | ICD-10-CM

## 2024-07-13 DIAGNOSIS — Z48817 Encounter for surgical aftercare following surgery on the skin and subcutaneous tissue: Secondary | ICD-10-CM

## 2024-07-13 LAB — CBC WITH DIFF
BASOPHIL #: 0 x10ˆ3/uL (ref 0.00–0.10)
BASOPHIL %: 0 % (ref 0–1)
EOSINOPHIL #: 0 x10ˆ3/uL (ref 0.00–0.60)
EOSINOPHIL %: 0 % — ABNORMAL LOW (ref 1–8)
HCT: 33.1 % — ABNORMAL LOW (ref 36.7–47.1)
HGB: 11 g/dL — ABNORMAL LOW (ref 12.5–16.3)
LYMPHOCYTE #: 1.1 x10ˆ3/uL (ref 1.00–3.00)
LYMPHOCYTE %: 9 % — ABNORMAL LOW (ref 15–43)
MCH: 29.6 pg (ref 23.8–33.4)
MCHC: 33.3 g/dL (ref 32.5–36.3)
MCV: 89 fL (ref 73.0–96.2)
MONOCYTE #: 0.9 x10ˆ3/uL (ref 0.30–1.10)
MONOCYTE %: 7 % (ref 6–14)
MPV: 10.8 fL (ref 7.4–11.4)
NEUTROPHIL #: 10.5 x10ˆ3/uL — ABNORMAL HIGH (ref 1.85–7.84)
NEUTROPHIL %: 84 % — ABNORMAL HIGH (ref 44–74)
PLATELETS: 189 x10ˆ3/uL (ref 140–440)
RBC: 3.73 x10ˆ6/uL — ABNORMAL LOW (ref 4.06–5.63)
RDW: 14 % (ref 12.1–16.2)
WBC: 12.6 x10ˆ3/uL — ABNORMAL HIGH (ref 3.6–10.2)

## 2024-07-13 LAB — COMPREHENSIVE METABOLIC PANEL, NON-FASTING
ALBUMIN/GLOBULIN RATIO: 1.2 (ref 0.8–1.4)
ALBUMIN: 4 g/dL (ref 3.5–5.7)
ALKALINE PHOSPHATASE: 83 U/L (ref 34–104)
ALT (SGPT): 48 U/L (ref 7–52)
ANION GAP: 11 mmol/L (ref 4–13)
AST (SGOT): 27 U/L (ref 13–39)
BILIRUBIN TOTAL: 0.5 mg/dL (ref 0.3–1.0)
BUN/CREA RATIO: 20 (ref 6–22)
BUN: 51 mg/dL — ABNORMAL HIGH (ref 7–25)
CALCIUM, CORRECTED: 9.3 mg/dL (ref 8.9–10.8)
CALCIUM: 9.3 mg/dL (ref 8.6–10.3)
CHLORIDE: 110 mmol/L — ABNORMAL HIGH (ref 98–107)
CO2 TOTAL: 18 mmol/L — ABNORMAL LOW (ref 21–31)
CREATININE: 2.61 mg/dL — ABNORMAL HIGH (ref 0.60–1.30)
ESTIMATED GFR: 26 mL/min/1.73mˆ2 — ABNORMAL LOW (ref >59)
GLOBULIN: 3.4 (ref 2.0–3.5)
GLUCOSE: 184 mg/dL — ABNORMAL HIGH (ref 74–109)
OSMOLALITY, CALCULATED: 296 mosm/kg — ABNORMAL HIGH (ref 270–290)
POTASSIUM: 4.3 mmol/L (ref 3.5–5.1)
PROTEIN TOTAL: 7.4 g/dL (ref 6.4–8.9)
SODIUM: 139 mmol/L (ref 136–145)

## 2024-07-13 LAB — PROCALCITONIN: PROCALCITONIN: 0.43 ng/mL (ref <0.50)

## 2024-07-13 LAB — POC BLOOD GLUCOSE (RESULTS): GLUCOSE, POC: 188 mg/dL — ABNORMAL HIGH (ref 70–100)

## 2024-07-13 LAB — MAGNESIUM: MAGNESIUM: 2.1 mg/dL (ref 1.9–2.7)

## 2024-07-13 MED ORDER — DOXYCYCLINE HYCLATE 100 MG CAPSULE: 100.0000 mg | ORAL_CAPSULE | Freq: Two times a day (BID) | ORAL | 0 refills | Status: AC

## 2024-07-13 MED ORDER — AMOXICILLIN 875 MG-POTASSIUM CLAVULANATE 125 MG TABLET: 1.0000 | ORAL_TABLET | Freq: Two times a day (BID) | ORAL | 0 refills | Status: AC

## 2024-07-13 NOTE — Discharge Instructions (Signed)
 Patient is encouraged to come back to the Emergency Department if any concerning signs or symptoms occur.

## 2024-07-13 NOTE — Nurses Notes (Signed)
 Patient's dressing on scrotum was changed as per orders. Was packed with iodoform and wrapped with guaze. CN helped with dressing change.

## 2024-07-13 NOTE — Care Plan (Signed)
 Shift Summary  Severe pain in the scrotal/incisional area required two doses of HYDROmorphone , but pain remained high after interventions.  Perineal wound dressing was changed due to saturation and small bloody drainage, with no new complications documented.  Infection prevention and safety measures were consistently implemented, including handwashing, fall prevention, and safety rounds.  Patient remained engaged in care, with care explanations and plan reviews documented.  Overall, the shift was marked by persistent severe pain and ongoing wound management, with safety and infection prevention measures maintained.    Absence of Hospital-Acquired Illness or Injury: Hourly safety rounds and fall prevention measures were consistently maintained, and infection prevention strategies such as handwashing and rest were promoted throughout the shift. Wound on the perineal area was assessed and dressing was changed due to small bloody drainage, with no documentation of new complications or changes in skin integrity.    Optimal Comfort and Wellbeing: Lightweight bedding and clothing were used for comfort, and pain interventions were provided as needed. Emotional state was calm and cooperative, and acceptance was verbalized regarding the current situation.    Knowledgeable about Health Subject/Topic: Care was explained, questions were answered, and the plan of care was reviewed with the patient to support understanding and engagement in care decisions.    Optimal Pain Control and Function: Pain in the scrotum and incisional area increased to severe levels during the shift, requiring two administrations of HYDROmorphone  for pain rated at 8; pain remained severe after interventions.    Goal Outcome Evaluation:     Anxieties, Fears or Concerns: concerned about the bloody drainage from abscess (07/12/24 2020)  Individualized Care Needs: monitor labs and vitals, monitor scrotum inscision site (07/12/24 2020)  Patient-Specific Goals  (Include Timeframe): d/c when medically able (07/12/24 2020)  Plan of Care Reviewed With: patient (07/12/24 2020)     Patient Progress: no change

## 2024-07-13 NOTE — Nurses Notes (Signed)
 Patient refused baby aspirin . Educated patient on the importance of taking medication. Patient still refused. MD notified.
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
## 2024-07-13 NOTE — Progress Notes (Signed)
 [facility]    HOSPITALIST PROGRESS NOTE    Matthew Sparks  Date of service: 07/12/2024  Date of Admission:  07/11/2024  Hospital Day:  LOS: 2 days     Interval History:   Patient was seen and evaluated at bedside.  Is scheduled for incision and drainage later today      Filed Vitals:    07/12/24 2343 07/13/24 0016 07/13/24 0445 07/13/24 0602   BP:  133/71 (!) 145/71    Pulse:  72 64    Resp: 13 13 13 17    Temp:  36.4 C (97.5 F) 36.3 C (97.3 F)    SpO2:  97% 99%        Intake/Output Summary (Last 24 hours) at 07/13/2024 0747  Last data filed at 07/13/2024 0157  Gross per 24 hour   Intake 1064.4 ml   Output 400 ml   Net 664.4 ml       SpO2: 99 %         Scheduled Medication PRN Medications     amLODIPine  (NORVASC ) tablet, 10 mg, Daily    aspirin  tablet 81 mg, 81 mg, Daily    atorvastatin  (LIPITOR) tablet, 80 mg, QPM    [Held by provider] clopidogrel  (PLAVIX ) 75 mg tablet, 75 mg, Daily    Correction/SSIP insulin  lispro 100 units/mL injection, 2-9 Units, 4x/day AC    heparin  5,000 unit/mL injection, 5,000 Units, Q8HRS    hydroCHLOROthiazide  (HYDRODIURIL ) tablet, 25 mg, Daily    insulin  glargine 100 units/mL injection, 25 Units, 2x/day    isosorbide  mononitrate (IMDUR ) 24 hr extended release tablet, 30 mg, Daily    linezolid  (ZYVOX ) 600 mg in iso-osmotic 300 mL premix IVPB, 600 mg, Q12H    losartan  (COZAAR ) tablet, 100 mg, Daily    metoprolol  succinate (TOPROL -XL) 24 hr extended release tablet, 150 mg, Daily    piperacillin -tazobactam (ZOSYN ) 4.5 g in NS 100 mL IVPB with adaptor, 4.5 g, Q8H    acetaminophen  (TYLENOL ) tablet, 650 mg, Q4H PRN    dextrose  (GLUTOSE) 40% oral gel, 15 g, Q15 Min PRN    dextrose  50% (0.5 g/mL) injection - syringe, 12.5 g, Q15 Min PRN    glucagon  injection 1 mg, 1 mg, Once PRN    HYDROmorphone  (DILAUDID ) 2 mg/mL injection, 0.2 mg, Q4H PRN    ipratropium-albuterol  0.5 mg-3 mg(2.5 mg base)/3 mL Solution for Nebulization, 3 mL, Q4H PRN    ondansetron  (ZOFRAN ) 2 mg/mL  injection, 4 mg, Q6H PRN      Insulin  Sliding Scale Requirement:  Antihyperglycemics (last 24 hours)       Date/Time Action Medication Dose    07/12/24 2154 Given    insulin  glargine 100 units/mL injection 25 Units    07/12/24 2153 Given    Correction/SSIP insulin  lispro 100 units/mL injection 3 Units    07/12/24 0812 Given    insulin  glargine 100 units/mL injection 25 Units             Labs:  CBC RESULTS CMP RESULTS   Recent Labs     07/11/24  1609 07/12/24  0335 07/13/24  0413   WBC 11.7* 10.2 12.6*   RBC 4.07 3.87* 3.73*   HGB 12.2* 11.5* 11.0*   HCT 35.8* 34.0* 33.1*   MCV 87.9 87.8 89.0   MCH 29.9 29.7 29.6   MCHC 34.0 33.9 33.3   RDW 14.2 14.2 14.0   PLTCNT 167 155 189   MPV 10.7 11.3 10.8  Recent Labs     07/11/24  1609 07/12/24  0335 07/13/24  0413   SODIUM 142 139 139   POTASSIUM 3.7 3.3* 4.3   CHLORIDE 112* 110* 110*   CO2 18* 18* 18*   ANIONGAP 12 11 11    BUN 43* 39* 51*   CREATININE 2.17* 2.01* 2.61*   BUNCRRATIO 20 19 20    GFR 32* 35* 26*   ALBUMIN 4.5 4.0 4.0   CALCIUM 9.8 9.4 9.3   GLUCOSENF 132* 218* 184*   ALKPHOS 111* 98 83   ALT 66* 54* 48   AST 39 29 27   TOTBILIRUBIN 0.5 0.5 0.5   TOTALPROTEIN 8.1 7.3 7.4   ALBGLOB 1.3 1.2 1.2   OSMBLD 296* 294* 296*   CORCA 9.4 9.4 9.3   GLOBULIN 3.6* 3.3 3.4      OTHER CHEMISTRIES RESULTS CREATININE CLEARENCE   Recent Labs     07/12/24  0335 07/13/24  0413   MAGNESIUM  2.0 2.1    Renal Creatinine (72h ago through now)       Date/Time Serum Creatinine Range CrCl    07/13/24 0413 2.61 mg/dL   9.39 - 8.69 mg/dL 69.2 mL/min    98/79/73 0335 2.01 mg/dL   9.39 - 8.69 mg/dL 60.0 mL/min    98/80/73 1609 2.17 mg/dL   9.39 - 8.69 mg/dL 63.0 mL/min            COAG RESULTS CARDIAC MARKERS   Recent Labs     07/11/24  2150   APTT 36.2   PROTHROMTME 12.5   INR 1.11*     Recent Labs     07/11/24  1609   LACTICACID 0.7        HA1C TSH LIPID PANEL   No results found for: HA1C   No results found for: TSH   No results found for: CHOLESTEROL, TRIG, HDLCHOL, LDLCHOL,  VLDLCAL, CHOLHDLRATIO       Assessment & Plan:    # Peritoneal abscess   General surgery was consulted and scheduled for incision and drainage later today   Currently on broad-spectrum antibiotics     # Chronic kidney disease stage IIIb  # Diabetes mellitus type 2   # Hypertension  # History of multiple CVA    Disposition Planning:  Pending clinical course      IVF:   Current Facility-Administered Medications   Medication Dose Frequency Last Rate    LR premix infusion   Continuous 100 mL/hr at 07/11/24 2341       Daily Orders (From admission, onward)       Start     Ordered    07/12/24 1400  heparin  5,000 unit/mL injection  (MEDIUM RISK VTE LEVEL)  EVERY 8 HOURS (SCHEDULED)         07/11/24 2135    07/11/24 2145  PT IS INTERMEDIATE RISK FOR VENOUS THROMBOEMBOLISM  (MEDIUM RISK VTE LEVEL)  CONTINUOUS        References:    VTE RISK ASSESSMENT TOOL    07/11/24 2135                   Anticoagulants (last 24 hours)       None           Lines:   Patient Lines/Drains/Airways Status       Active Line / Dialysis Catheter / Dialysis Graft / Drain / Airway / Wound       Name Placement date Placement time Site  Days    Peripheral IV Left;Posterior Wrist 07/13/24  0505  -- less than 1    Wound  Incision Perineum 07/12/24  --  -- 1                   Diet: Nutrition:    DIET DIABETIC Calorie amount: CC 2000; Do you want to initiate MNT Protocol? Yes    Additional clinical characteristics related to nutrition:    - monitor for weight changes   - monitor intake and output    - monitor bowel functions                 On the day of the encounter, a total of 25 minutes was spent on this patient encounter including review of historical information, examination, documentation and post-visit activities. The time documented excludes procedural time and time spent on wellness portion of the visit.     Everett Ehrler Jess Meals, MD  07/12/2024  [facility] MEDICINE HOSPITALIST

## 2024-07-13 NOTE — Consults (Signed)
 COpAT/ID [email or web address] Initial Note    Reason for Consult: Currently on Zosyn .  Perineal abscess status post incision and drainage 07/20/2024.  Can I changed the antibiotics to Augmentin  and he can finish a course of another 5-7 days?       Pertinent Clinical History:    This is a 69 year old male who presented to [facility] on July 11, 2024 with perineal pain and swelling. He developed itching and swelling in his scrotal and perineal region about two weeks prior which he attributed to an allergic reaction from a new diabetic medication. However, the swelling and pain progressed. He denied any fever. He was taken to the OR on July 12, 2024 for I&D of a perineal abscess.     Antimicrobials: Zosyn       PMHx: Type 2 diabetes mellitus HbA1c 7.2%, chronic kidney disease, hypertension, hyperlipidemia, obesity BMI 30    Antimicrobial Allergies: None      Labs, Micro, and Imaging Reviewed:    Labs:  CBC 07/13/2024: WBC 12.6, hemoglobin 11, platelets 189  Creatinine 07/13/2024: 2.61, CrCl 30.7  CRP 07/11/2024: 2.9    Microbiology:  Blood cultures 07/11/2024: no growth x 18-24h  Sterile site culture 07/12/2024: gram positive rods, gram negative rods, gram positive cocci clusters on gram stain    Imaging:  CT abdomen/pelvis 07/11/2024: no inflammatory changes seen in the subcutaneous fat    Assessment:  69 year old male admitted with perineal abscess status post I&D. OR cultures polymicrobial.    Recommendations:     Discontinue Zosyn .  Start po Augmentin .  Start po doxycycline  or linezolid .  Treat for 5 more days.    I spent 5 minutes or more reviewing the patient's medical record, lab/micro/imaging studies, and/or medications.  Communicated with provider through Secure Chat/In [employer].  Total time >31 min.     Loie Jahr, DO

## 2024-07-13 NOTE — Discharge Summary (Addendum)
 [facility] MEDICINE [facility]     DISCHARGE SUMMARY      PATIENT NAME:  Matthew Sparks   MRN:  [id number]  DOB:  1955/11/06    INPATIENT ADMISSION DATE: 07/11/2024   DATE OF DISCHARGE:  07/13/2024     ATTENDING PHYSICIAN:  Lendia Riesa Skiff, MD    HOSPITAL PRESENTATION:  Please see full admission H&P for details.    As per HPI:    HOSPITAL COURSE:    69 year old male who presented to [facility] on July 11, 2024 with perineal pain and swelling. He developed itching and swelling in his scrotal and perineal region about two weeks prior which he attributed to an allergic reaction from a new diabetic medication. However, the swelling and pain progressed. He denied any fever. He was taken to the OR on July 12, 2024 for I&D of a perineal abscess.   He will need packing changes daily with iodoform strip gauze   Clear for discharge from surgical point   Surgery okay with the resuming back aspirin  and Plavix  from Monday 07/18/2024    Discharge Diagnosis:    # Perineal abscess status post incision and drainage  # Diabetes mellitus type 2  # Chronic kidney disease stage IIIB  # Hypertension  # History of multiple CVA    DISCHARGE INSTRUCTIONS:  Follow up with General surgery in 2 weeks   Can start taking his Eliquis and aspirin  from Monday (07/18/2024)       Refer to Home Health - EXTERNAL            Copies sent to Care Team         Relationship Specialty Notifications Start End    Jarold City, APRN, CNP PCP - General FAMILY NURSE PRACTITIONER  01/27/23     Phone: [phone] Fax: [phone]         [address] [place] [address]            DISCHARGE MEDICATIONS:     Current Discharge Medication List        START taking these medications.        Details   amoxicillin -pot clavulanate 875-125 mg Tablet  Commonly known as: AUGMENTIN    1 Tablet, Oral, 2 TIMES DAILY  Qty: 10 Tablet  Refills: 0     doxycycline  hyclate 100 mg Capsule  Commonly known as: VIBRAMYCIN    100 mg, Oral, 2 TIMES  DAILY  Qty: 10 Capsule  Refills: 0            CONTINUE these medications - NO CHANGES were made during your visit.        Details   * albuterol  sulfate 90 mcg/actuation oral inhaler  Commonly known as: PROVENTIL  or VENTOLIN  or PROAIR    1-2 Puffs, EVERY 6 HOURS PRN  Refills: 0     * albuterol  2.5 mg/0.5 mL Solution for Nebulization  Commonly known as: PROVENTIL    2.5 mg, 3 TIMES DAILY  Refills: 0     allopurinol  100 mg Tablet  Commonly known as: ZYLOPRIM    100 mg, Oral, Daily  Qty: 90 Tablet  Refills: 3     amLODIPine  10 mg Tablet  Commonly known as: NORVASC    10 mg, Oral, Daily  Qty: 90 Tablet  Refills: 0     aspirin  325 mg Tablet   81 mg, Daily  Refills: 0     atorvastatin  80 mg Tablet  Commonly known as: LIPITOR   80 mg, Oral, EVERY EVENING  Qty: 90 Tablet  Refills: 1     Blood Glucose Test Strip  Generic drug: Blood Sugar Diagnostic   Use as directed  Qty: 300 Each  Refills: 2     Blood-Glucose Meter Misc   Glucometer of choice.  Use as directed.  Qty: 1 Each  Refills: 0     chlorTHALIDONE  50 mg Tablet  Commonly known as: HYGROTON    50 mg, EVERY MORNING  Refills: 0     clopidogrel  75 mg Tablet  Commonly known as: PLAVIX    75 mg, Oral, Daily  Qty: 90 Tablet  Refills: 1     cyclobenzaprine  5 mg Tablet  Commonly known as: FLEXERIL    5 mg, 3 TIMES DAILY  Refills: 0     dapagliflozin  propanediol 10 mg Tablet  Commonly known as: Farxiga    10 mg, Oral, Daily  Qty: 90 Tablet  Refills: 3     Dexcom G7 Sensor Device  Generic drug: Blood-Glucose Sensor   1 Each, Does not apply, EVERY 10 DAYS  Qty: 9 Each  Refills: 3     ergocalciferol  (vitamin D2) 1,250 mcg (50,000 unit) Capsule  Commonly known as: DRISDOL    50,000 Units, Oral, EVERY 7 DAYS  Qty: 12 Capsule  Refills: 0     Ibuprofen 800 mg Tablet  Commonly known as: MOTRIN   800 mg, 3 TIMES DAILY PRN  Refills: 0     insulin  glargine U-300 conc 300 unit/mL (3 mL) Insulin  Pen  Commonly known as: Toujeo  Max U-300 SoloStar   50 Units, Subcutaneous, 2 TIMES DAILY  Qty: 30  mL  Refills: 0     isosorbide  mononitrate 30 mg Tablet Sustained Release 24 hr  Commonly known as: IMDUR    30 mg, Daily  Refills: 0     ketoconazole 2 % Cream  Commonly known as: NIZORAL   APPLY TOPICALLY TO BOTH FEET TWICE DAILY  Refills: 0     Lancets Misc   Use as directed  Qty: 300 Each  Refills: 2     linaGLIPtin  5 mg Tablet  Commonly known as: TRADJENTA    5 mg, Oral, Daily  Qty: 90 Tablet  Refills: 4     losartan  100 mg Tablet  Commonly known as: COZAAR    100 mg, Daily  Refills: 0     * metoprolol  succinate 100 mg Tablet Sustained Release 24 hr  Commonly known as: TOPROL -XL   100 mg, Oral, Daily  Qty: 90 Tablet  Refills: 1     * metoprolol  succinate 50 mg Tablet Sustained Release 24 hr  Commonly known as: TOPROL -XL   50 mg, Daily  Refills: 0     multivitamin Tablet   1 Tablet, Daily  Refills: 0     nitroGLYCERIN  0.3 mg Tablet, Sublingual  Commonly known as: NITROSTAT    0.3 mg, EVERY 5 MIN PRN  Refills: 0     omega 3-dha-epa-fish oil 183.3 mg-75 mg -91.6 mg-306 mg Capsule   1 Capsule, Daily  Refills: 0     potassium chloride  10 mEq Tablet Sustained Release  Commonly known as: KLOR-CON    10 mEq, EVERY MORNING  Refills: 0           * This list has 4 medication(s) that are the same as other medications prescribed for you. Read the directions carefully, and ask your doctor or other care provider to review them with you.  DISCHARGE DISPOSITION:  Home      LABS WITHIN LAST 24 HOURS:   Results for orders placed or performed during the hospital encounter of 07/11/24 (from the past 24 hours)   POC BLOOD GLUCOSE (RESULTS)   Result Value Ref Range    GLUCOSE, POC 111 (H) 70 - 100 mg/dl   STERILE SITE CULTURE AND GRAM STAIN, AEROBIC    Specimen: Other (Specify Site in comments); Body fluid   Result Value Ref Range    GRAM STAIN 2+ Few PMNs (A)     GRAM STAIN 2+ Few Gram Positive Rods (A)     GRAM STAIN 2+ Few Gram Negative Rod (A)     GRAM STAIN 1+ Rare Gram Positive Cocci/Clusters (A)    POC BLOOD  GLUCOSE (RESULTS)   Result Value Ref Range    GLUCOSE, POC 125 (H) 70 - 100 mg/dl   POC BLOOD GLUCOSE (RESULTS)   Result Value Ref Range    GLUCOSE, POC 122 (H) 70 - 100 mg/dl   POC BLOOD GLUCOSE (RESULTS)   Result Value Ref Range    GLUCOSE, POC 240 (H) 70 - 100 mg/dl   COMPREHENSIVE METABOLIC PANEL, NON-FASTING   Result Value Ref Range    SODIUM 139 136 - 145 mmol/L    POTASSIUM 4.3 3.5 - 5.1 mmol/L    CHLORIDE 110 (H) 98 - 107 mmol/L    CO2 TOTAL 18 (L) 21 - 31 mmol/L    ANION GAP 11 4 - 13 mmol/L    BUN 51 (H) 7 - 25 mg/dL    CREATININE 7.38 (H) 0.60 - 1.30 mg/dL    BUN/CREA RATIO 20 6 - 22    ESTIMATED GFR 26 (L) >59 mL/min/1.55m2    ALBUMIN 4.0 3.5 - 5.7 g/dL    CALCIUM 9.3 8.6 - 89.6 mg/dL    GLUCOSE 815 (H) 74 - 109 mg/dL    ALKALINE PHOSPHATASE 83 34 - 104 U/L    ALT (SGPT) 48 7 - 52 U/L    AST (SGOT) 27 13 - 39 U/L    BILIRUBIN TOTAL 0.5 0.3 - 1.0 mg/dL    PROTEIN TOTAL 7.4 6.4 - 8.9 g/dL    ALBUMIN/GLOBULIN RATIO 1.2 0.8 - 1.4    OSMOLALITY, CALCULATED 296 (H) 270 - 290 mOsm/kg    CALCIUM, CORRECTED 9.3 8.9 - 10.8 mg/dL    GLOBULIN 3.4 2.0 - 3.5   MAGNESIUM    Result Value Ref Range    MAGNESIUM  2.1 1.9 - 2.7 mg/dL   CBC WITH DIFF   Result Value Ref Range    WBC 12.6 (H) 3.6 - 10.2 x103/uL    RBC 3.73 (L) 4.06 - 5.63 x106/uL    HGB 11.0 (L) 12.5 - 16.3 g/dL    HCT 66.8 (L) 63.2 - 47.1 %    MCV 89.0 73.0 - 96.2 fL    MCH 29.6 23.8 - 33.4 pg    MCHC 33.3 32.5 - 36.3 g/dL    RDW 85.9 87.8 - 83.7 %    PLATELETS 189 140 - 440 x103/uL    MPV 10.8 7.4 - 11.4 fL    NEUTROPHIL % 84 (H) 44 - 74 %    LYMPHOCYTE % 9 (L) 15 - 43 %    MONOCYTE % 7 6 - 14 %    EOSINOPHIL % 0 (L) 1 - 8 %    BASOPHIL % 0 0 - 1 %    NEUTROPHIL # 10.50 (H) 1.85 - 7.84 x103/uL  LYMPHOCYTE # 1.10 1.00 - 3.00 x103/uL    MONOCYTE # 0.90 0.30 - 1.10 x103/uL    EOSINOPHIL # 0.00 0.00 - 0.60 x103/uL    BASOPHIL # 0.00 0.00 - 0.10 x103/uL   POC BLOOD GLUCOSE (RESULTS)   Result Value Ref Range    GLUCOSE, POC 188 (H) 70 - 100 mg/dl         IMAGING WITHIN LAST 24 HOURS:   US  SCROTUM  Result Date: 07/11/2024  Impression No evidence of scrotal abscess or other acute inflammatory process. No evidence of testicular torsion. Left-sided varicocele. Radiologist location ID: [id number]     CT ABDOMEN PELVIS WO IV CONTRAST  Result Date: 07/11/2024  Impression No inflammatory changes are seen in the subcutaneous fat. No dilated bowel or intraperitoneal inflammatory changes. Radiologist location ID: [id number]     US  SCROTUM   Final Result   No evidence of scrotal abscess or other acute inflammatory process.      No evidence of testicular torsion.      Left-sided varicocele.         Radiologist location ID: [id number]         CT ABDOMEN PELVIS WO IV CONTRAST   Final Result   No inflammatory changes are seen in the subcutaneous fat.      No dilated bowel or intraperitoneal inflammatory changes.         Radiologist location ID: [id number]              MICROBIOLOGY WITHIN LAST 24 HOURS:   Hospital Encounter on 07/11/24 (from the past 24 hours)   STERILE SITE CULTURE AND GRAM STAIN, AEROBIC    Collection Time: 07/12/24  1:29 PM    Specimen: Other (Specify Site in comments); Body fluid   Culture Result Status    GRAM STAIN 2+ Few PMNs (A) Preliminary    GRAM STAIN 2+ Few Gram Positive Rods (A) Preliminary    GRAM STAIN 2+ Few Gram Negative Rod (A) Preliminary    GRAM STAIN 1+ Rare Gram Positive Cocci/Clusters (A) Preliminary        Nutrition:   DIET DIABETIC Calorie amount: CC 2000; Do you want to initiate MNT Protocol? Yes             Additional clinical characteristics related to nutrition:    - monitor for weight changes   - monitor intake and output    - monitor bowel functions        Lab Results   Component Value Date    ALBUMIN 4.0 07/13/2024        The hospitalist examined patient, reviewed material, and agreed with discharge at this time.   >30 minutes total were spent coordinating discharge day today    Taiga Lupinacci Jess Meals, MD  [facility]    07/13/2024      This note was partially created using voice recognition software and is inherently subject to errors including those of syntax and sound alike  substitutions which may escape proof reading. In such instances, original meaning may be extrapolated by contextual derivation.

## 2024-07-13 NOTE — Care Management Notes (Signed)
 Referral Information  ++++++ Placed Provider #1 ++++++  Case Manager: Glade Molt  Provider Type: Home Health  Provider Name: [facility] [place] [place] ([facility]ly [facility] [place])  Address:  [address]  [address]  [place], [place] [address]  Contact: VERNEITA BELLMAN    Phone: [phone] x  Fax:   Fax: [phone]

## 2024-07-13 NOTE — Care Management Notes (Signed)
 [facility]  Care Management Initial Evaluation    Patient Name: Matthew Sparks  Date of Birth: Sep 24, 1955  Sex: male  Date/Time of Admission: 07/11/2024 12:57 PM  Room/Bed: 205/A  Payor: AETNA-MEDI-ADV / Plan: AETNA MEDICARE ADVANTAGE (MC) / Product Type: MEDICARE MC /   Primary Care Providers:  Jarold City, APRN, CNP, APRN, CNP (General)    Pharmacy Info:   Preferred Pharmacy       [facility] [id number] - [place], [place] - [address] AT [facility] [address]    [address] [place] [address]    Phone: [phone] Fax: [phone]    Hours: Not open 24 hours          Emergency Contact Info:   Extended Emergency Contact Information  Primary Emergency Contact: Reed,Wanda   [place]  Mobile Phone: [phone]  Relation: Significant other    History:   CORNELIOUS Sparks is a 69 y.o., male, admitted to [facility] on 07/11/24.    Height/Weight: 177.8 cm (5' 10) / 96.5 kg (212 lb 12.8 oz)     LOS: 2 days   Admitting Diagnosis: Swelling of perineal tissue [R22.2]    Assessment:      07/13/24 1141   Insurance Information/Type   Insurance type [employer] Environment   Lives With significant other   Living Arrangements house   Able to Return to Prior Arrangements yes   Living Arrangement Comments Patient advised that he lives with his significant other, Matthew Sparks.  He has a 2 story home.  He states there is a bedroom and bathroom on the first floor.  There are 3 steps to enter the home.  There are 12 steps to the basement.   Care Management Plan   Discharge Planning Status initial meeting   Discharge plan discussed with: Patient   CM will evaluate for rehabilitation potential yes   Patient choice offered to patient/family Yes   Facility or Agency Preferences [facility]   Discharge Needs Assessment   Equipment Currently Used at Home cane, straight   Equipment Needed After Discharge none   Discharge Facility/Level of Care Needs Home with Home Health (code 6)          Discharge Plan:  Home with Home Health (code 6)  Received a referral for home health for dressing changes.  Met with patient to discuss.  He advised that he lives at home with his significant other, Matthew Sparks.  He states that he is normally able to ambulate within his home.  His other DME is a cane.   Discussed home health agencies and he chose [facility].  They have been sent a referral for nursing.  They will contact patient at home to set up a time to begin services.     The patient will continue to be evaluated for developing discharge needs.     Case Manager: Randine Plaster, MSW  Phone: [phone]

## 2024-07-13 NOTE — Nurses Notes (Signed)
 Patient was discharged home with all personal belongings. IV and telemetry removed. Pt given discharge information, follow up appointments, and medication information. Pt educated to pick up her scripts, keep follow up appointments and take all medication as directed. Pt educated to call 911 for emergencies or return to ER. Pt verbalizes understanding to do the above. Patient and significant other verbalized all understanding of discharge instructions. Questions asked and answered. Denies further questions. NAD. Pt transported to personal vehicle via wheelchair, with all belongings. Belongings sheet signed and on the chart.

## 2024-07-14 LAB — STERILE SITE CULTURE AND GRAM STAIN, AEROBIC

## 2024-07-15 ENCOUNTER — Telehealth (HOSPITAL_COMMUNITY): Payer: Self-pay

## 2024-07-16 LAB — ADULT ROUTINE BLOOD CULTURE, SET OF 2 BOTTLES (BACTERIA AND YEAST)
BLOOD CULTURE, ROUTINE: NO GROWTH
BLOOD CULTURE, ROUTINE: NO GROWTH

## 2024-07-17 LAB — ANAEROBIC CULTURE

## 2024-07-20 ENCOUNTER — Telehealth (INDEPENDENT_AMBULATORY_CARE_PROVIDER_SITE_OTHER): Payer: Self-pay

## 2024-07-20 NOTE — Telephone Encounter (Signed)
 Matthew Sparks (Self) called regarding his diabetic medications - patient stated that the medications that his provider at this clinic placed him on have caused him to have a reaction, which he was in the hospital for. Asked patient which medications he was taking, to which he responded The pill and the insulin . Patient stated that the medications do not work, and that These are not what Dr. Vinson prescribed me before.     Provider notified via staff message.

## 2024-07-26 ENCOUNTER — Telehealth (INDEPENDENT_AMBULATORY_CARE_PROVIDER_SITE_OTHER): Payer: Self-pay

## 2024-07-26 NOTE — Telephone Encounter (Signed)
 Matthew Sparks (Self) called regarding his diabetic medications - he had previously called about being put back on what his previous endocrinologist had him on. Patient stated that the glimepiride that he was on previously worked for him. Advised patient of the following message from his providers regarding this medication: I don't think it is safe to put him back on glipizide, and would not recommend it. If he is not satisfied with this he can see his PCP for his diabetes or get another opinion.     Advised patient that he is entitled to another opinion if he does not agree with his providers, patient did not acknowledge this. Patient then stated that it was not the glimepiride, and instead it is his Toujeo  insulin . Patient stated that Dr. Vinson had him on Toujeo  as well, but it was different. Patient was unable to explain what the difference is.    Patient stated that he would bring his medications in to the office soon for us  to look at. Advised patient again that he is entitled to another opinion if he does not agree with his providers, patient did not acknowledge this, again.     Provider notified of conversation and patient's concerns via staff message.

## 2024-07-27 ENCOUNTER — Encounter (INDEPENDENT_AMBULATORY_CARE_PROVIDER_SITE_OTHER): Payer: Self-pay

## 2024-07-27 NOTE — Nursing Note (Signed)
 Patient presented to the clinic as a walk-in, visibly upset, stating that the clinic prescribed the wrong insulin . Patient reported that the insulin  recently dispensed is different than what his previous endocrinologist, Dr. Vinson had prescribed, and that it caused him a reaction - patient was seen in the ED on 07/11/24 for itching and swelling in his scrotal and perineal region and was diagnosed with a perineal abscess, to which he attributed to his diabetic medications.     Patient brought both his previous insulin  pen and current insulin  pen to the clinic. Upon visual inspection by this RN and provider, both pens were confirmed to be the same medication, with the difference noted to be that the label on the new pen's box was covering the generic name (brand name vs generic name). This was explained to the patient, including that the active ingredient and dosing are the same.     Patient was educated by this RN and provider regarding brand vs. generic medication naming and reassured that the prescription written was correct. Patient continued to express disagreement with the explanation provided.     Patient was informed that if he does not feel comfortable with his care provided at this clinic, he is entitled to seek a second opinion or establish care elsewhere. Patient did not acknowledge this remark - patient then stated that it was not the insulin  but instead the oral medication, Tradjenta , that caused the reaction. Patient requested to be placed back on the glimepiride that Dr. Vinson had prescribed him previously - patient has already received notice that his providers are not comfortable placing him back on this due to his CKD. Patient was informed again by this RN and provider that our clinic does not recommend restarting glimepiride at this time due to his history of CKD and associated safety concerns, including risk of hypoglycemia.     Provider advised patient that we could increase his insulin  dose or  add a meal time insulin  if he has concerns with his blood sugar readings without an oral medication. Provider advised patient that she would contact his other provider in [place] regarding his request - patient verbalized agreement with this plan.     No acute stress noted during encounter. Provider aware and involved in discussion.

## 2024-07-29 ENCOUNTER — Encounter (INDEPENDENT_AMBULATORY_CARE_PROVIDER_SITE_OTHER): Payer: Self-pay | Admitting: GENERAL SURGERY

## 2024-08-24 ENCOUNTER — Ambulatory Visit (HOSPITAL_BASED_OUTPATIENT_CLINIC_OR_DEPARTMENT_OTHER): Payer: Self-pay

## 2024-10-07 ENCOUNTER — Other Ambulatory Visit (INDEPENDENT_AMBULATORY_CARE_PROVIDER_SITE_OTHER): Payer: Self-pay

## 2024-10-14 ENCOUNTER — Encounter (INDEPENDENT_AMBULATORY_CARE_PROVIDER_SITE_OTHER): Payer: Self-pay

## 2024-10-17 ENCOUNTER — Ambulatory Visit (HOSPITAL_BASED_OUTPATIENT_CLINIC_OR_DEPARTMENT_OTHER): Payer: Self-pay
